# Patient Record
Sex: Female | Born: 1980 | Race: White | Hispanic: No | Marital: Married | Smoking: Never smoker
Health system: Southern US, Community
[De-identification: ages and names within clinical notes are randomized; demographics above are authoritative.]

## PROBLEM LIST (undated history)

## (undated) ENCOUNTER — Inpatient Hospital Stay (HOSPITAL_COMMUNITY): Payer: Self-pay

## (undated) DIAGNOSIS — Z789 Other specified health status: Secondary | ICD-10-CM

## (undated) HISTORY — PX: PILONIDAL CYST DRAINAGE: SHX743

## (undated) HISTORY — PX: DILATION AND CURETTAGE OF UTERUS: SHX78

---

## 2008-08-28 ENCOUNTER — Encounter (INDEPENDENT_AMBULATORY_CARE_PROVIDER_SITE_OTHER): Payer: Self-pay | Admitting: Obstetrics and Gynecology

## 2008-08-28 ENCOUNTER — Ambulatory Visit (HOSPITAL_COMMUNITY): Admission: RE | Admit: 2008-08-28 | Discharge: 2008-08-28 | Payer: Self-pay | Admitting: Radiology

## 2009-11-06 ENCOUNTER — Inpatient Hospital Stay (HOSPITAL_COMMUNITY): Admission: AD | Admit: 2009-11-06 | Discharge: 2009-11-08 | Payer: Self-pay | Admitting: Obstetrics and Gynecology

## 2010-09-01 LAB — CBC
HCT: 31.6 % — ABNORMAL LOW (ref 36.0–46.0)
Hemoglobin: 11.1 g/dL — ABNORMAL LOW (ref 12.0–15.0)
MCV: 96.7 fL (ref 78.0–100.0)
Platelets: 146 10*3/uL — ABNORMAL LOW (ref 150–400)
RDW: 13.1 % (ref 11.5–15.5)

## 2010-09-25 LAB — CBC
MCHC: 34.3 g/dL (ref 30.0–36.0)
MCV: 95.4 fL (ref 78.0–100.0)
RBC: 3.95 MIL/uL (ref 3.87–5.11)
RDW: 12.1 % (ref 11.5–15.5)

## 2010-10-28 NOTE — Op Note (Signed)
NAMESHIRI, HODAPP NO.:  [id number]   MEDICAL RECORD NO.:  [id number]          PATIENT TYPE:  AMB   LOCATION:  SDC                           FACILITY:  [facility]   PHYSICIAN:  Carrington Clamp, M.D. DATE OF BIRTH:  March 20, 1981   DATE OF PROCEDURE:  08/28/2008  DATE OF DISCHARGE:                               OPERATIVE REPORT   PREOPERATIVE DIAGNOSIS:  Missed abortion.   POSTOPERATIVE DIAGNOSIS:  Missed abortion.   PROCEDURE:  Dilation and evacuation.   SURGEON:  Carrington Clamp, MD.   ASSISTANT:  None.   ANESTHESIA:  LMA.   FINDINGS:  Seven-week size uterus down to 6 weeks with good crie.   SPECIMENS:  Uterine contents.   DISPOSITION:  To Pathology.   ESTIMATED BLOOD LOSS:  100 mL.   INTRAVENOUS FLUIDS:  1300 mL.   URINE OUTPUT:  Not measured.   COMPLICATIONS:  None.   MEDICATIONS:  Methergine IM.   COUNTS:  Correct x3.   TECHNIQUE:  After adequate LMA anesthesia was achieved, the patient was  prepped and draped in usual sterile fashion in dorsal lithotomy  position.  The bladder was emptied with a red rubber catheter and then  speculum placed in the vagina.  The cervix was dilated up with Shawnie Pons  dilators.  An 8-mm curette was unable to be passed.  A 7-mm  curette was used with alternating sharp and blunt dissection to remove  all uterine contents.  Once good crie was seen with sharp curettage, all  instruments were withdrawn from the vagina.  The patient tolerated  procedure well and returned to recovery room in stable condition.      Carrington Clamp, M.D.  Electronically Signed     MH/MEDQ  D:  08/28/2008  T:  08/29/2008  Job:  [id number]

## 2011-04-13 ENCOUNTER — Other Ambulatory Visit: Payer: Self-pay | Admitting: Obstetrics and Gynecology

## 2013-08-29 ENCOUNTER — Other Ambulatory Visit: Payer: Self-pay | Admitting: Obstetrics and Gynecology

## 2014-09-27 ENCOUNTER — Other Ambulatory Visit: Payer: Self-pay | Admitting: Obstetrics and Gynecology

## 2014-09-28 LAB — CYTOLOGY - PAP

## 2016-02-04 ENCOUNTER — Other Ambulatory Visit: Payer: Self-pay | Admitting: Family Medicine

## 2016-02-04 ENCOUNTER — Ambulatory Visit
Admission: RE | Admit: 2016-02-04 | Discharge: 2016-02-04 | Disposition: A | Discharge status: Home / Self Care | Payer: BLUE CROSS/BLUE SHIELD | Source: Ambulatory Visit | Attending: Family Medicine | Admitting: Family Medicine

## 2016-02-04 DIAGNOSIS — M545 Low back pain: Secondary | ICD-10-CM | POA: Diagnosis not present

## 2016-02-04 DIAGNOSIS — R52 Pain, unspecified: Secondary | ICD-10-CM

## 2016-02-25 ENCOUNTER — Ambulatory Visit: Payer: BLUE CROSS/BLUE SHIELD | Admitting: Physical Therapy

## 2016-02-28 ENCOUNTER — Encounter: Payer: BLUE CROSS/BLUE SHIELD | Admitting: Physical Therapy

## 2016-03-02 ENCOUNTER — Encounter: Payer: BLUE CROSS/BLUE SHIELD | Admitting: Physical Therapy

## 2016-03-10 ENCOUNTER — Ambulatory Visit: Payer: BLUE CROSS/BLUE SHIELD | Attending: Family Medicine | Admitting: Physical Therapy

## 2016-03-10 DIAGNOSIS — M25552 Pain in left hip: Secondary | ICD-10-CM | POA: Insufficient documentation

## 2016-03-10 DIAGNOSIS — M6281 Muscle weakness (generalized): Secondary | ICD-10-CM

## 2016-03-10 DIAGNOSIS — M25551 Pain in right hip: Secondary | ICD-10-CM | POA: Insufficient documentation

## 2016-03-10 NOTE — Therapy (Signed)
[place] [facility] [address] [place], [address] Phone: [phone]   Fax:  [phone]  Physical Therapy Evaluation  Patient Details  Name: Linda Webster MRN: [id number] Date of Birth: [birth year] Referring Provider: Jolene Provost  Encounter Date: 03/10/2016      PT End of Session - 03/10/16 1231    Visit Number 1   Number of Visits 9   Date for PT Re-Evaluation 05/05/16   PT Start Time 0815   PT Stop Time 0910   PT Time Calculation (min) 55 min   Activity Tolerance Patient tolerated treatment well;No increased pain   Behavior During Therapy [facility] for tasks assessed/performed      No past medical history on file.  No past surgical history on file.  There were no vitals filed for this visit.       Subjective Assessment - 03/10/16 1230    Subjective Pt is a runner, has had 1.5 yrs of "deep pain" in the region of her piriformis with aerobics workout. she was able to participate in a marathon with stretches issued by [employer].  Pt does have back and leg painwhich has radiated out from initial pain of piriformis. She has been not running as much but has been doing a bootcamp class, she is still having pain despite this. Pt has to travel significantly and is having pain with sitting/traveling which produces pain on L side.    Currently in Pain? Yes   Pain Score 4    Pain Location Hip   Pain Orientation Left            [facility] PT Assessment - 03/10/16 0001      Assessment   Medical Diagnosis piriformis syndrome   Referring Provider patel, kirtida   Prior Therapy none for this     Precautions   Precautions None     Restrictions   Weight Bearing Restrictions No     Balance Screen   Has the patient fallen in the past 6 months No   Has the patient had a decrease in activity level because of a fear of falling?  No   Is the patient reluctant to leave their home because of a fear of falling?  No     Home  [employer] information centre manager residence     Prior Function   Level of Independence Independent   Vocation Full time employment   Vocation Requirements sitting   Leisure active lifestyle, aerobics, running.     ROM / Strength   AROM / PROM / Strength AROM     AROM   Overall AROM Comments Full movement assessment: hypertonic multifidi, pain with and limited ROM in B hip flexion, extension, ER, L IR highly limited and painful. Strength is 5/5 but painful for B hip extension.     Palpation   Spinal mobility Pain with PA mobs over L2-L5   Palpation comment pain with palpation of glute, piriformis, vastus lateralis,                            PT Education - 03/10/16 1231    Education provided Yes   Education Details Course of PT, HEP   Person(s) Educated Patient   Methods Explanation   Comprehension Verbalized understanding             PT Long Term Goals - 03/10/16 1448      PT LONG  TERM GOAL #1   Title Pt will be I with HEP to improve motor control on L side to decr. poor femoral control as seen by SLS to 30 sec. with no valgus.   Baseline very poor femoral control with SLS.   Time 8   Period Weeks   Status New     PT LONG TERM GOAL #2   Title Pt will be able to run pain free   Baseline Pain with running for 5 min   Time 8   Period Weeks   Status New     PT LONG TERM GOAL #3   Title Pt will report no pain with palpation of piriformis indicating decr. myofascial trigger points and decr. resting pain   Baseline Significant pain with piriformis assessment   Time 8   Period Weeks   Status New               Plan - 03/10/16 1232    Clinical Impression Statement Pt is a pleasant 35 y/o female with c/o chronic hip pain. Pain began 1.5 yrs ago when pt began an aerobic routine. Pt is now unable to run or do aerobics due to pain. She is able to manage pain but is limiting her activity. Pt recently found out she is [redacted] wks pregnant and  reported some concern about how that will affect her pain and ability to participate in exercises. Pt has a high stress job involving travel which is painful for her. Pt would benefit from skilled PT to address these issues.    Rehab Potential Good   Clinical Impairments Affecting Rehab Potential chronic pain/motivation   PT Frequency 1x / week   PT Duration 8 weeks   PT Treatment/Interventions ADLs/Self Care Home Management;Aquatic Therapy;Dry needling;Therapeutic exercise;Patient/family education;Functional mobility training;Neuromuscular re-education;Manual techniques;Passive range of motion      Patient will benefit from skilled therapeutic intervention in order to improve the following deficits and impairments:  Pain, Improper body mechanics, Hypomobility, Decreased strength, Decreased range of motion  Visit Diagnosis: Muscle weakness (generalized) - Plan: PT plan of care cert/re-cert  Pain in left hip - Plan: PT plan of care cert/re-cert  Pain in right hip - Plan: PT plan of care cert/re-cert     Problem List There are no active problems to display for this patient.   Fisher,Benjamin PT DPT 03/10/2016, 2:55 PM  [facility] [address] , [place], [address] Phone: [phone]   Fax:  [phone]  Name: Linda Webster MRN: [id number] Date of Birth: 05/21/1981

## 2016-03-17 ENCOUNTER — Ambulatory Visit: Payer: BLUE CROSS/BLUE SHIELD | Attending: Family Medicine | Admitting: Physical Therapy

## 2016-03-17 DIAGNOSIS — M25552 Pain in left hip: Secondary | ICD-10-CM | POA: Diagnosis present

## 2016-03-17 DIAGNOSIS — M6281 Muscle weakness (generalized): Secondary | ICD-10-CM

## 2016-03-17 DIAGNOSIS — M25551 Pain in right hip: Secondary | ICD-10-CM | POA: Diagnosis present

## 2016-03-17 NOTE — Therapy (Signed)
[place] [facility] [address] [place], [place], [address] Phone: [phone]   Fax:  [phone]  Physical Therapy Treatment  Patient Details  Name: Linda Webster MRN: [id number] Date of Birth: 05/01/1981 Referring Provider: Jolene Provostpatel, kirtida  Encounter Date: 03/17/2016      PT End of Session - 03/17/16 1022    Visit Number 2   Number of Visits 9   Date for PT Re-Evaluation 05/05/16   PT Start Time 0945   PT Stop Time 1023   PT Time Calculation (min) 38 min   Activity Tolerance Patient tolerated treatment well;No increased pain   Behavior [facility] tasks assessed/performed      No past medical history on file.  No past surgical history on file.  There were no vitals filed for this visit.      Subjective Assessment - 03/17/16 0946    Subjective Pt reports she is "doing okay" today. When she walks she can feel significant tension in L IT band. She has noticed decr. ability to perform    Currently in Pain? Yes   Pain Score 3    Pain Location Hip   Pain Orientation Left                 Objective: Reviewed pelvic control/heel slide exercise.  Progressed this on R with UE overhead and cuing for rib control.    Attempted progression of this on L but unable to do so.   Standing partial lunge practice, cuing for performance of this.   Extensive STM performed on glute med/max, piriformis,  Vastus lateralis.  Trigger point dry needling performed following informed consent in vastus lateralis (no charge for this).  Following this pt reported "I definitely am more sore, but the pain is less with walking". Pt will attempt this HEP.                     PT Long Term Goals - 03/10/16 1448      PT LONG TERM GOAL #1   Title Pt will be I with HEP to improve motor control on L side to decr. poor femoral control as seen by SLS to 30 sec. with no valgus.   Baseline very poor femoral  control with SLS.   Time 8   Period Weeks   Status New     PT LONG TERM GOAL #2   Title Pt will be able to run pain free   Baseline Pain with running for 5 min   Time 8   Period Weeks   Status New     PT LONG TERM GOAL #3   Title Pt will report no pain with palpation of piriformis indicating decr. myofascial trigger points and decr. resting pain   Baseline Significant pain with piriformis assessment   Time 8   Period Weeks   Status New               Plan - 03/17/16 1023    Clinical Impression Statement Pt responded very well to gentle trigger oint dry needling, manual intervention for hip, and progression of ther ex to standing and more aggressive supine work. minimal pain following session.    Rehab Potential Good   Clinical Impairments Affecting Rehab Potential chronic pain/motivation   PT Frequency 1x / week   PT Duration 8 weeks   PT Treatment/Interventions ADLs/Self Care Home Management;Aquatic Therapy;Dry needling;Therapeutic exercise;Patient/family education;Functional mobility training;Neuromuscular re-education;Manual techniques;Passive range of  motion      Patient will benefit from skilled therapeutic intervention in order to improve the following deficits and impairments:  Pain, Improper body mechanics, Hypomobility, Decreased strength, Decreased range of motion  Visit Diagnosis: Muscle weakness (generalized)  Pain in left hip  Pain in right hip     Problem List There are no active problems to display for this patient.   Webster,Linda 03/17/2016, 10:26 AM   [facility] [address], [place], [address] Phone: [phone]   Fax:  [phone]  Name: Linda Webster MRN: [id number] Date of Birth: 09/04/1980

## 2016-03-30 ENCOUNTER — Ambulatory Visit: Payer: BLUE CROSS/BLUE SHIELD | Admitting: Physical Therapy

## 2016-04-03 ENCOUNTER — Other Ambulatory Visit: Payer: Self-pay | Admitting: Obstetrics and Gynecology

## 2016-04-13 ENCOUNTER — Ambulatory Visit: Payer: BLUE CROSS/BLUE SHIELD | Admitting: Physical Therapy

## 2016-04-13 DIAGNOSIS — M6281 Muscle weakness (generalized): Secondary | ICD-10-CM

## 2016-04-14 NOTE — Therapy (Signed)
[place] [facility] [address] [place], [place], [address] Phone: [phone]   Fax:  [phone]  Physical Therapy Treatment  Patient Details  Name: Linda Webster MRN: [id number] Date of Birth: 10/13/1980 Referring Provider: Jolene Provostpatel, kirtida  Encounter Date: 04/13/2016      PT End of Session - 04/13/16 0758    Visit Number 3   Number of Visits 9   Date for PT Re-Evaluation 05/05/16   PT Start Time 1300   PT Stop Time 1310   PT Time Calculation (min) 10 min   Activity Tolerance Patient tolerated treatment well;No increased pain   Behavior [facility] for tasks assessed/performed      No past medical history on file.  No past surgical history on file.  There were no vitals filed for this visit.      Subjective Assessment - 04/13/16 1337    Subjective Pt has not been seen for several sessions due to busy schedule. Continues to have some tension in IT band with walking. otherwise she has been fully pain free and would like to not continue with PT at this time.                  No objective data taken today as pt requested to not have a PT session due to resolution of pain and focus on pregnancy and work activities.                    PT Long Term Goals - 04/13/16 0759      PT LONG TERM GOAL #1   Title Pt will be I with HEP to improve motor control on L side to decr. poor femoral control as seen by SLS to 30 sec. with no valgus.   Baseline very poor femoral control with SLS.   Time 8   Period Weeks   Status Achieved     PT LONG TERM GOAL #2   Title Pt will be able to run pain free   Baseline Pain with running for 5 min   Time 8   Period Weeks   Status Deferred     PT LONG TERM GOAL #3   Title Pt will report no pain with palpation of piriformis indicating decr. myofascial trigger points and decr. resting pain   Baseline Significant pain with piriformis assessment   Time 8    Period Weeks   Status Achieved               Plan - 04/13/16 0758    Clinical Impression Statement At this time pt is on hold from PT, she is feeling much less pain, is focusing on pregnancy and job activities. Encouraged pt to return if needed but otherwise will look to re-start PT in the future as needed if pain returns.   Rehab Potential Good   Clinical Impairments Affecting Rehab Potential chronic pain/motivation   PT Frequency 1x / week   PT Duration 8 weeks   PT Treatment/Interventions ADLs/Self Care Home Management;Aquatic Therapy;Dry needling;Therapeutic exercise;Patient/family education;Functional mobility training;Neuromuscular re-education;Manual techniques;Passive range of motion      Patient will benefit from skilled therapeutic intervention in order to improve the following deficits and impairments:  Pain, Improper body mechanics, Hypomobility, Decreased strength, Decreased range of motion  Visit Diagnosis: Muscle weakness (generalized)     Problem List There are no active problems to display for this patient.   Remedios Mckone PT DPT 04/14/2016, 8:00 AM  [place] [facility] [address] , [place], [address] Phone: [phone]   Fax:  [phone]  Name: Linda CorinLisa Torrance MRN: [id number] Date of Birth: 08/09/1980

## 2016-04-20 ENCOUNTER — Ambulatory Visit: Payer: BLUE CROSS/BLUE SHIELD | Admitting: Physical Therapy

## 2016-04-27 ENCOUNTER — Ambulatory Visit: Payer: BLUE CROSS/BLUE SHIELD | Attending: Family Medicine | Admitting: Physical Therapy

## 2016-05-04 ENCOUNTER — Ambulatory Visit: Payer: BLUE CROSS/BLUE SHIELD | Admitting: Physical Therapy

## 2016-05-11 ENCOUNTER — Ambulatory Visit: Payer: BLUE CROSS/BLUE SHIELD | Admitting: Physical Therapy

## 2016-06-15 NOTE — L&D Delivery Note (Signed)
Delivery Note Patient pushed for less than 15 minutes after she was noted to be C/C/_1. At 4:04 AM a viable and healthy female was delivered via Vaginal, Spontaneous Delivery (Presentation OA).Tight nuchal cord reduced on perineum.  Shoulders and body easily delivered. Baby laid on maternal abdomen.   APGAR: 9, 9; weight 7 lb 0.7 oz (3195 g).  Cord double clamped and father cut umbilical cord. Placenta delivered spontaneously, intact 3 vessels. Cord blood obtained.  Complications: none Anesthesia:  Epidural  Episiotomy: None Lacerations: 2nd degree Suture Repair: 2.0 vicryl rapide Est. Blood Loss (mL): 350  Mom to postpartum.  Baby to Couplet care / Skin to Skin.  Essie HartINN, Eartha Vonbehren STACIA 10/22/2016, 7:11 AM

## 2016-09-22 ENCOUNTER — Encounter (HOSPITAL_COMMUNITY): Payer: Self-pay | Admitting: *Deleted

## 2016-09-22 ENCOUNTER — Ambulatory Visit (HOSPITAL_COMMUNITY): Admit: 2016-09-22 | Payer: BLUE CROSS/BLUE SHIELD

## 2016-09-22 ENCOUNTER — Inpatient Hospital Stay (HOSPITAL_COMMUNITY)
Admission: AD | Admit: 2016-09-22 | Discharge: 2016-09-22 | Disposition: A | Discharge status: Home / Self Care | Payer: BLUE CROSS/BLUE SHIELD | Source: Ambulatory Visit | Attending: Obstetrics | Admitting: Obstetrics

## 2016-09-22 ENCOUNTER — Inpatient Hospital Stay (HOSPITAL_COMMUNITY): Payer: BLUE CROSS/BLUE SHIELD

## 2016-09-22 ENCOUNTER — Ambulatory Visit (HOSPITAL_BASED_OUTPATIENT_CLINIC_OR_DEPARTMENT_OTHER)
Admit: 2016-09-22 | Discharge: 2016-09-22 | Disposition: A | Discharge status: Home / Self Care | Payer: BLUE CROSS/BLUE SHIELD | Attending: Obstetrics and Gynecology | Admitting: Obstetrics and Gynecology

## 2016-09-22 DIAGNOSIS — M79662 Pain in left lower leg: Secondary | ICD-10-CM | POA: Insufficient documentation

## 2016-09-22 DIAGNOSIS — M79605 Pain in left leg: Secondary | ICD-10-CM | POA: Insufficient documentation

## 2016-09-22 DIAGNOSIS — O163 Unspecified maternal hypertension, third trimester: Secondary | ICD-10-CM | POA: Diagnosis not present

## 2016-09-22 DIAGNOSIS — Z3A33 33 weeks gestation of pregnancy: Secondary | ICD-10-CM | POA: Insufficient documentation

## 2016-09-22 DIAGNOSIS — M7989 Other specified soft tissue disorders: Secondary | ICD-10-CM

## 2016-09-22 DIAGNOSIS — M79609 Pain in unspecified limb: Secondary | ICD-10-CM

## 2016-09-22 DIAGNOSIS — O26893 Other specified pregnancy related conditions, third trimester: Secondary | ICD-10-CM | POA: Diagnosis not present

## 2016-09-22 DIAGNOSIS — O2653 Maternal hypotension syndrome, third trimester: Secondary | ICD-10-CM

## 2016-09-22 DIAGNOSIS — O9989 Other specified diseases and conditions complicating pregnancy, childbirth and the puerperium: Secondary | ICD-10-CM

## 2016-09-22 DIAGNOSIS — R0602 Shortness of breath: Secondary | ICD-10-CM

## 2016-09-22 HISTORY — DX: Other specified health status: Z78.9

## 2016-09-22 LAB — CBC
HEMATOCRIT: 28.2 % — AB (ref 36.0–46.0)
HEMOGLOBIN: 10 g/dL — AB (ref 12.0–15.0)
MCH: 33.7 pg (ref 26.0–34.0)
MCHC: 35.5 g/dL (ref 30.0–36.0)
MCV: 94.9 fL (ref 78.0–100.0)
Platelets: 145 10*3/uL — ABNORMAL LOW (ref 150–400)
RBC: 2.97 MIL/uL — AB (ref 3.87–5.11)
RDW: 13.1 % (ref 11.5–15.5)
WBC: 7.5 10*3/uL (ref 4.0–10.5)

## 2016-09-22 LAB — COMPREHENSIVE METABOLIC PANEL
ALT: 17 U/L (ref 14–54)
ANION GAP: 5 (ref 5–15)
AST: 23 U/L (ref 15–41)
Albumin: 2.9 g/dL — ABNORMAL LOW (ref 3.5–5.0)
Alkaline Phosphatase: 66 U/L (ref 38–126)
BUN: 10 mg/dL (ref 6–20)
CHLORIDE: 105 mmol/L (ref 101–111)
CO2: 24 mmol/L (ref 22–32)
Calcium: 8.3 mg/dL — ABNORMAL LOW (ref 8.9–10.3)
Creatinine, Ser: 0.46 mg/dL (ref 0.44–1.00)
Glucose, Bld: 79 mg/dL (ref 65–99)
POTASSIUM: 4 mmol/L (ref 3.5–5.1)
Sodium: 134 mmol/L — ABNORMAL LOW (ref 135–145)
Total Bilirubin: 0.9 mg/dL (ref 0.3–1.2)
Total Protein: 5.7 g/dL — ABNORMAL LOW (ref 6.5–8.1)

## 2016-09-22 LAB — D-DIMER, QUANTITATIVE (NOT AT ARMC): D DIMER QUANT: 0.89 ug{FEU}/mL — AB (ref 0.00–0.50)

## 2016-09-22 LAB — TROPONIN I

## 2016-09-22 MED ORDER — IOPAMIDOL (ISOVUE-370) INJECTION 76%
100.0000 mL | Freq: Once | INTRAVENOUS | Status: AC | PRN
  Administered 2016-09-22: 100 mL via INTRAVENOUS

## 2016-09-22 MED ORDER — LACTATED RINGERS IV BOLUS (SEPSIS)
1000.0000 mL | Freq: Once | INTRAVENOUS | Status: AC
  Administered 2016-09-22: 1000 mL via INTRAVENOUS

## 2016-09-22 NOTE — Discharge Instructions (Signed)
Edema Edema is when you have too much fluid in your body or under your skin. Edema may make your legs, feet, and ankles swell up. Swelling is also common in looser tissues, like around your eyes. This is a common condition. It gets more common as you get older. There are many possible causes of edema. Eating too much salt (sodium) and being on your feet or sitting for a long time can cause edema in your legs, feet, and ankles. Hot weather may make edema worse. Edema is usually painless. Your skin may look swollen or shiny. Follow these instructions at home:  Keep the swollen body part raised (elevated) above the level of your heart when you are sitting or lying down.  Do not sit still or stand for a long time.  Do not wear tight clothes. Do not wear garters on your upper legs.  Exercise your legs. This can help the swelling go down.  Wear elastic bandages or support stockings as told by your doctor.  Eat a low-salt (low-sodium) diet to reduce fluid as told by your doctor.  Depending on the cause of your swelling, you may need to limit how much fluid you drink (fluid restriction).  Take over-the-counter and prescription medicines only as told by your doctor. Contact a doctor if:  Treatment is not working.  You have heart, liver, or kidney disease and have symptoms of edema.  You have sudden and unexplained weight gain. Get help right away if:  You have shortness of breath or chest pain.  You cannot breathe when you lie down.  You have pain, redness, or warmth in the swollen areas.  You have heart, liver, or kidney disease and get edema all of a sudden.  You have a fever and your symptoms get worse all of a sudden. Summary  Edema is when you have too much fluid in your body or under your skin.  Edema may make your legs, feet, and ankles swell up. Swelling is also common in looser tissues, like around your eyes.  Raise (elevate) the swollen body part above the level of your  heart when you are sitting or lying down.  Follow your doctor's instructions about diet and how much fluid you can drink (fluid restriction). This information is not intended to replace advice given to you by your health care provider. Make sure you discuss any questions you have with your health care provider. Document Released: 11/18/2007 Document Revised: 06/19/2016 Document Reviewed: 06/19/2016 Elsevier Interactive Patient Education  2017 Elsevier Inc.  

## 2016-09-22 NOTE — Progress Notes (Signed)
**  Preliminary report by tech**  Left lower extremity venous duplex complete. There is no evidence of deep or superficial vein thrombosis involving the left lower extremity. All visualized vessels appear patent and compressible. There is no evidence of a Baker's cyst on the left. Results were given to the patient's nurse, Jolynn.  09/22/16 1:41 PM Olen Cordial RVT

## 2016-09-22 NOTE — MAU Note (Signed)
Vascular lab informed of U/S order.

## 2016-09-22 NOTE — MAU Note (Signed)
Left calf swelling noted since Sat.  "charlie horse" in left calf, started Thu, just not letting up.  No hx of clot.

## 2016-09-22 NOTE — MAU Note (Signed)
Dr Schenk in to see pt 

## 2016-09-22 NOTE — MAU Provider Note (Signed)
History     CSN: [id number]  Arrival date and time: 09/22/16 0900   First Provider Initiated Contact with Patient 09/22/16 1009      Chief Complaint  Patient presents with  . calf pain  . Leg Swelling   Patient is a 36 year old G3 P1 at 33 weeks 1 day who presents today with calf pain and swelling. She reports her pain is been present for about 48 hours. It is throbbing in nature and was significantly more swollen yesterday. Upon arrival patient did have an episode with blood pressure 40 systolic associated with some shortness of breath lightheadedness and nausea. With lying the patient back and checking serial blood pressures it has increased but still remains less than 100 systolic. She reports her normal blood pressure is greater than 100 systolic. She still does not quite feel herself. As she begins to feel better she does not report any palpitations or shortness of breath however. She also has no chest pain. She reports she ate a large fracture best prior to going to her doctor's office this morning. She reports no personal history of blood clots and denies a family history of blood clots.    OB History    Gravida Para Term Preterm AB Living   SAB TAB Ectopic Multiple Live Births                  Past Medical History:  Diagnosis Date  . Medical history non-contributory     Past Surgical History:  Procedure Laterality Date  . DILATION AND CURETTAGE OF UTERUS    . PILONIDAL CYST DRAINAGE      No family history on file.  Social History  Substance Use Topics  . Smoking status: Never Smoker  . Smokeless tobacco: Never Used  . Alcohol use No    Allergies: No Known Allergies  Prescriptions Prior to Admission  Medication Sig Dispense Refill Last Dose  . Prenatal Vit-Fe Fumarate-FA (PRENATAL MULTIVITAMIN) TABS tablet Take 1 tablet by mouth at bedtime.   09/21/2016 at Unknown time    Review of Systems  Constitutional: Negative for chills and fever.   HENT: Negative for congestion and rhinorrhea.   Respiratory: Negative for apnea, cough, chest tightness and shortness of breath.   Cardiovascular: Positive for leg swelling. Negative for chest pain and palpitations.  Gastrointestinal: Negative for abdominal distention, abdominal pain, constipation, diarrhea, nausea and vomiting.  Genitourinary: Negative for difficulty urinating, dysuria, flank pain, frequency and hematuria.  Musculoskeletal: Negative for arthralgias and back pain.  Neurological: Negative for dizziness and weakness.   Physical Exam   Blood pressure 103/60, pulse 80, temperature 98.5 F (36.9 C), temperature source Oral, resp. rate 16, weight 160 lb 8 oz (72.8 kg), last menstrual period 02/03/2016, SpO2 100 %.  Physical Exam  Vitals reviewed. Constitutional: She is oriented to person, place, and time. She appears well-developed and well-nourished.  HENT:  Head: Normocephalic and atraumatic.  Cardiovascular: Normal rate and normal heart sounds.   Respiratory: Effort normal and breath sounds normal. No respiratory distress. She has no wheezes.  GI: Soft. Bowel sounds are normal. She exhibits no distension. There is no tenderness. There is no rebound and no guarding.  Musculoskeletal: Normal range of motion.  Edema on the left calf  Neurological: She is alert and oriented to person, place, and time. No cranial nerve deficit.  Skin: Skin is warm and dry. No erythema.  Psychiatric: She has a normal mood  and affect. Her behavior is normal.    MAU Course  Procedures  MDM In MAU patient initially had an episode of hypotension with near syncope. She was given a liter of fluid with increase in BP. She did symptomatically feel better at that time. CBC, CMP D-dimer and trop where checked and all were wnl for pregnancy.  D-dimer minimally elevated, but in the normal range for 3rd trimester.  Patient had a Doppler of her left lower extremity which had been swollen and was  negative however given these episodes of hypertension there was concerned that the clot could've potentially migrated to the heart and lungs causing these symptoms. Patient did not become hypoxic or tachycardic but given her pregnancy and symptoms we believe prudent to rule out PE.  CTA of the chest was performed and found to be negative with no pulmonary pathology including signs of pulmonary embolus.  It was discharged home with instructions to follow up with her OBs next week for potential referral to cardiology if symptoms of hypotension and near syncope continued.  Assessment and Plan  #1: Left calf swelling workup negative for PE advised elevation of the legs and symptomatic control. #2: Hypertension unclear etiology may be vagal symptoms from stress or anxiety ruled out PE at this time. Follow up recommended with cardiology if symptoms continue. Discussed with Dr. Stephannie Li 09/22/2016, 10:45 AM

## 2016-09-22 NOTE — MAU Note (Signed)
Urine in lab 

## 2016-09-22 NOTE — Progress Notes (Signed)
CNM informed pt feeling very hot, dizzy & clammy.  Also informed of low BP.  Pt conscious & talking the entire time.

## 2016-10-07 ENCOUNTER — Other Ambulatory Visit: Payer: Self-pay | Admitting: Obstetrics & Gynecology

## 2016-10-21 ENCOUNTER — Inpatient Hospital Stay (HOSPITAL_COMMUNITY)
Admission: AD | Admit: 2016-10-21 | Discharge: 2016-10-23 | DRG: 775 | Disposition: A | Discharge status: Home / Self Care | Payer: BLUE CROSS/BLUE SHIELD | Source: Ambulatory Visit | Attending: Obstetrics & Gynecology | Admitting: Obstetrics & Gynecology

## 2016-10-21 ENCOUNTER — Encounter (HOSPITAL_COMMUNITY): Payer: Self-pay | Admitting: *Deleted

## 2016-10-21 ENCOUNTER — Inpatient Hospital Stay (HOSPITAL_COMMUNITY): Payer: BLUE CROSS/BLUE SHIELD | Admitting: Anesthesiology

## 2016-10-21 DIAGNOSIS — Z3A37 37 weeks gestation of pregnancy: Secondary | ICD-10-CM | POA: Diagnosis not present

## 2016-10-21 DIAGNOSIS — Z3493 Encounter for supervision of normal pregnancy, unspecified, third trimester: Secondary | ICD-10-CM | POA: Diagnosis present

## 2016-10-21 LAB — CBC
HCT: 35.4 % — ABNORMAL LOW (ref 36.0–46.0)
HEMOGLOBIN: 12.4 g/dL (ref 12.0–15.0)
MCH: 33.3 pg (ref 26.0–34.0)
MCHC: 35 g/dL (ref 30.0–36.0)
MCV: 95.2 fL (ref 78.0–100.0)
Platelets: 163 10*3/uL (ref 150–400)
RBC: 3.72 MIL/uL — ABNORMAL LOW (ref 3.87–5.11)
RDW: 13.5 % (ref 11.5–15.5)
WBC: 9 10*3/uL (ref 4.0–10.5)

## 2016-10-21 LAB — TYPE AND SCREEN
ABO/RH(D): O POS
ANTIBODY SCREEN: NEGATIVE

## 2016-10-21 MED ORDER — FLEET ENEMA 7-19 GM/118ML RE ENEM: 1.0000 | ENEMA | RECTAL | Status: DC | PRN

## 2016-10-21 MED ORDER — ACETAMINOPHEN 325 MG PO TABS: 650.0000 mg | ORAL_TABLET | ORAL | Status: DC | PRN

## 2016-10-21 MED ORDER — FENTANYL 2.5 MCG/ML BUPIVACAINE 1/10 % EPIDURAL INFUSION (WH - ANES)
14.0000 mL/h | INTRAMUSCULAR | Status: DC | PRN
  Administered 2016-10-21: 14 mL/h via EPIDURAL
  Filled 2016-10-21: qty 100

## 2016-10-21 MED ORDER — LIDOCAINE HCL (PF) 1 % IJ SOLN
INTRAMUSCULAR | Status: DC | PRN
  Administered 2016-10-21 (×2): 5 mL

## 2016-10-21 MED ORDER — DIPHENHYDRAMINE HCL 50 MG/ML IJ SOLN: 12.5000 mg | INTRAMUSCULAR | Status: DC | PRN

## 2016-10-21 MED ORDER — EPHEDRINE 5 MG/ML INJ
10.0000 mg | INTRAVENOUS | Status: DC | PRN
  Filled 2016-10-21: qty 2

## 2016-10-21 MED ORDER — LACTATED RINGERS IV SOLN: 500.0000 mL | Freq: Once | INTRAVENOUS | Status: DC

## 2016-10-21 MED ORDER — LACTATED RINGERS IV SOLN: 500.0000 mL | INTRAVENOUS | Status: DC | PRN

## 2016-10-21 MED ORDER — LACTATED RINGERS IV SOLN
INTRAVENOUS | Status: DC
  Administered 2016-10-21: 22:00:00 via INTRAVENOUS

## 2016-10-21 MED ORDER — PHENYLEPHRINE 40 MCG/ML (10ML) SYRINGE FOR IV PUSH (FOR BLOOD PRESSURE SUPPORT)
80.0000 ug | PREFILLED_SYRINGE | INTRAVENOUS | Status: AC | PRN
  Administered 2016-10-21 (×3): 80 ug via INTRAVENOUS

## 2016-10-21 MED ORDER — OXYTOCIN BOLUS FROM INFUSION
500.0000 mL | Freq: Once | INTRAVENOUS | Status: AC
  Administered 2016-10-22: 500 mL via INTRAVENOUS

## 2016-10-21 MED ORDER — EPHEDRINE 5 MG/ML INJ
10.0000 mg | INTRAVENOUS | Status: DC | PRN
  Administered 2016-10-21: 10 mg via INTRAVENOUS
  Filled 2016-10-21: qty 2

## 2016-10-21 MED ORDER — SOD CITRATE-CITRIC ACID 500-334 MG/5ML PO SOLN: 30.0000 mL | ORAL | Status: DC | PRN

## 2016-10-21 MED ORDER — PHENYLEPHRINE 40 MCG/ML (10ML) SYRINGE FOR IV PUSH (FOR BLOOD PRESSURE SUPPORT)
80.0000 ug | PREFILLED_SYRINGE | INTRAVENOUS | Status: DC | PRN
  Filled 2016-10-21: qty 5
  Filled 2016-10-21: qty 10

## 2016-10-21 MED ORDER — OXYCODONE-ACETAMINOPHEN 5-325 MG PO TABS
1.0000 | ORAL_TABLET | ORAL | Status: DC | PRN
  Administered 2016-10-22 (×2): 1 via ORAL
  Filled 2016-10-21 (×2): qty 1

## 2016-10-21 MED ORDER — OXYCODONE-ACETAMINOPHEN 5-325 MG PO TABS: 2.0000 | ORAL_TABLET | ORAL | Status: DC | PRN

## 2016-10-21 MED ORDER — OXYTOCIN 40 UNITS IN LACTATED RINGERS INFUSION - SIMPLE MED
2.5000 [IU]/h | INTRAVENOUS | Status: DC
  Administered 2016-10-22: 2.5 [IU]/h via INTRAVENOUS
  Filled 2016-10-21: qty 1000

## 2016-10-21 MED ORDER — LIDOCAINE HCL (PF) 1 % IJ SOLN
30.0000 mL | INTRAMUSCULAR | Status: DC | PRN
  Filled 2016-10-21: qty 30

## 2016-10-21 MED ORDER — ONDANSETRON HCL 4 MG/2ML IJ SOLN
4.0000 mg | Freq: Four times a day (QID) | INTRAMUSCULAR | Status: DC | PRN
  Administered 2016-10-21: 4 mg via INTRAVENOUS
  Filled 2016-10-21: qty 2

## 2016-10-21 NOTE — Anesthesia Pain Management Evaluation Note (Signed)
  CRNA Pain Management Visit Note  Patient: Linda Webster, 36 y.o., female  "Hello I am a member of the anesthesia team at [facility]. We have an anesthesia team available at all times to provide care throughout the hospital, including epidural management and anesthesia for C-section. I don't know your plan for the delivery whether it a natural birth, water birth, IV sedation, nitrous supplementation, doula or epidural, but we want to meet your pain goals."   1.Was your pain managed to your expectations on prior hospitalizations?   Yes   2.What is your expectation for pain management during this hospitalization?     Epidural  3.How can we help you reach that goal?   Record the patient's initial score and the patient's pain goal.   Pain: 7  Pain Goal: 7 [facility] wants you to be able to say your pain was always managed very well.  Laban EmperorMalinova,Linda Webster 10/21/2016

## 2016-10-21 NOTE — H&P (Signed)
Linda Webster is a 36 y.o. female presenting for regular painful contractions. No LOF, no vaginal bleeding, She notes  Good FM.  Her prenatal course was uncomplicated.     OB History    Gravida Para Term Preterm AB Living   3 1 1   1 1    SAB TAB Ectopic Multiple Live Births                 Past Medical History:  Diagnosis Date  . Medical history non-contributory    Past Surgical History:  Procedure Laterality Date  . DILATION AND CURETTAGE OF UTERUS    . PILONIDAL CYST DRAINAGE     Family History: family history is not on file. Social History:  reports that she has never smoked. She has never used smokeless tobacco. She reports that she does not drink alcohol or use drugs.     Maternal Diabetes: No Genetic Screening: Normal   NIPT 46XX Maternal Ultrasounds/Referrals: Normal Fetal Ultrasounds or other Referrals:  Other:  Normal fetal US Maternal Substance Abuse:  No Significant Maternal Medications:  None Significant Maternal Lab Results:  Lab values include: Group B Strep negative Other Comments:  None  ROS Maternal Medical History:  Reason for admission: Contractions.   Contractions: Onset was 13-24 hours ago.   Frequency: regular.   Duration is approximately 60 seconds.   Perceived severity is strong.    Fetal activity: Perceived fetal activity is normal.   Last perceived fetal movement was within the past hour.    Prenatal complications: no prenatal complications Prenatal Complications - Diabetes: none.    Dilation: 4 Effacement (%): 80 Station: -3 Exam by:: B Mosca Blood pressure (!) 103/56, pulse 75, temperature 97.8 F (36.6 C), temperature source Oral, resp. rate 18, height 5\' 7"  (1.702 m), weight 75.3 kg (166 lb), last menstrual period 02/03/2016. Maternal Exam:  Uterine Assessment: Contraction strength is moderate.  Contraction duration is 60 seconds. Contraction frequency is regular.   Abdomen: Patient reports no abdominal tenderness. Fundal height  is 37 cm.   Estimated fetal weight is 3000 grams.   Fetal presentation: vertex  Introitus: Normal vulva. Normal vagina.  Ferning test: not done.  Nitrazine test: not done.  Pelvis: adequate for delivery.   Cervix: I checked patient in office today: 4/90/-1  Fetal Exam Fetal Monitor Review: Baseline rate: 145.  Variability: moderate (6-25 bpm).   Pattern: accelerations present and no decelerations.    Fetal State Assessment: Category I - tracings are normal.     Physical Exam  Nursing note and vitals reviewed.   Prenatal labs: ABO, Rh:  O +  Antibody:  Negative Rubella:  Immune RPR:   NR  HBsAg:   Neg HIV:   NR GBS:   Neg  Assessment/Plan: 36 yo G3P1011 at 37 weeks 2 days in early active labor Patient with h/o very quick progression to delivery with her last delivery.  Admit to L&D Epidural on demand Continuous monitoring Augment with AROM    Tore Carreker STACIA 10/21/2016, 9:56 PM

## 2016-10-21 NOTE — Anesthesia Procedure Notes (Signed)
Epidural Patient location during procedure: OB  Staffing Anesthesiologist: Bayley Hurn Performed: anesthesiologist   Preanesthetic Checklist Completed: patient identified, site marked, surgical consent, pre-op evaluation, timeout performed, IV checked, risks and benefits discussed and monitors and equipment checked  Epidural Patient position: sitting Prep: DuraPrep Patient monitoring: heart rate, continuous pulse ox and blood pressure Approach: right paramedian Location: L3-L4 Injection technique: LOR saline  Needle:  Needle type: Tuohy  Needle gauge: 17 G Needle length: 9 cm and 9 Needle insertion depth: 6 cm Catheter type: closed end flexible Catheter size: 20 Guage Catheter at skin depth: 10 cm Test dose: negative  Assessment Events: blood not aspirated, injection not painful, no injection resistance, negative IV test and no paresthesia  Additional Notes Patient identified. Risks/Benefits/Options discussed with patient including but not limited to bleeding, infection, nerve damage, paralysis, failed block, incomplete pain control, headache, blood pressure changes, nausea, vomiting, reactions to medication both or allergic, itching and postpartum back pain. Confirmed with bedside nurse the patient's most recent platelet count. Confirmed with patient that they are not currently taking any anticoagulation, have any bleeding history or any family history of bleeding disorders. Patient expressed understanding and wished to proceed. All questions were answered. Sterile technique was used throughout the entire procedure. Please see nursing notes for vital signs. Test dose was given through epidural needle and negative prior to continuing to dose epidural or start infusion. Warning signs of high block given to the patient including shortness of breath, tingling/numbness in hands, complete motor block, or any concerning symptoms with instructions to call for help. Patient was given  instructions on fall risk and not to get out of bed. All questions and concerns addressed with instructions to call with any issues.     

## 2016-10-21 NOTE — MAU Note (Addendum)
Pt reports uc's and history of rapid delivery. Pt was 4 cm in office today

## 2016-10-21 NOTE — MAU Note (Signed)
Urine sent to lab 

## 2016-10-21 NOTE — Anesthesia Preprocedure Evaluation (Signed)

## 2016-10-22 ENCOUNTER — Encounter (HOSPITAL_COMMUNITY): Payer: Self-pay

## 2016-10-22 LAB — ABO/RH: ABO/RH(D): O POS

## 2016-10-22 LAB — RPR: RPR Ser Ql: NONREACTIVE

## 2016-10-22 MED ORDER — ONDANSETRON HCL 4 MG/2ML IJ SOLN: 4.0000 mg | INTRAMUSCULAR | Status: DC | PRN

## 2016-10-22 MED ORDER — COCONUT OIL OIL: 1.0000 "application " | TOPICAL_OIL | Status: DC | PRN

## 2016-10-22 MED ORDER — IBUPROFEN 600 MG PO TABS
600.0000 mg | ORAL_TABLET | Freq: Four times a day (QID) | ORAL | Status: DC
  Administered 2016-10-22 – 2016-10-23 (×5): 600 mg via ORAL
  Filled 2016-10-22 (×5): qty 1

## 2016-10-22 MED ORDER — TERBUTALINE SULFATE 1 MG/ML IJ SOLN
0.2500 mg | Freq: Once | INTRAMUSCULAR | Status: DC | PRN
  Filled 2016-10-22: qty 1

## 2016-10-22 MED ORDER — TETANUS-DIPHTH-ACELL PERTUSSIS 5-2.5-18.5 LF-MCG/0.5 IM SUSP: 0.5000 mL | Freq: Once | INTRAMUSCULAR | Status: DC

## 2016-10-22 MED ORDER — OXYTOCIN 40 UNITS IN LACTATED RINGERS INFUSION - SIMPLE MED
1.0000 m[IU]/min | INTRAVENOUS | Status: DC
  Administered 2016-10-22: 2 m[IU]/min via INTRAVENOUS

## 2016-10-22 MED ORDER — ACETAMINOPHEN 325 MG PO TABS: 650.0000 mg | ORAL_TABLET | ORAL | Status: DC | PRN

## 2016-10-22 MED ORDER — DIBUCAINE 1 % RE OINT
1.0000 | TOPICAL_OINTMENT | RECTAL | Status: DC | PRN
Start: 2016-10-22 — End: 2016-10-23

## 2016-10-22 MED ORDER — PRENATAL MULTIVITAMIN CH
1.0000 | ORAL_TABLET | Freq: Every day | ORAL | Status: DC
  Administered 2016-10-22 – 2016-10-23 (×2): 1 via ORAL
  Filled 2016-10-22 (×2): qty 1

## 2016-10-22 MED ORDER — ONDANSETRON HCL 4 MG PO TABS: 4.0000 mg | ORAL_TABLET | ORAL | Status: DC | PRN

## 2016-10-22 MED ORDER — DIPHENHYDRAMINE HCL 25 MG PO CAPS: 25.0000 mg | ORAL_CAPSULE | Freq: Four times a day (QID) | ORAL | Status: DC | PRN

## 2016-10-22 MED ORDER — BENZOCAINE-MENTHOL 20-0.5 % EX AERO
1.0000 "application " | INHALATION_SPRAY | CUTANEOUS | Status: DC | PRN
  Administered 2016-10-22: 1 via TOPICAL
  Filled 2016-10-22 (×2): qty 56

## 2016-10-22 MED ORDER — SIMETHICONE 80 MG PO CHEW: 80.0000 mg | CHEWABLE_TABLET | ORAL | Status: DC | PRN

## 2016-10-22 MED ORDER — WITCH HAZEL-GLYCERIN EX PADS: 1.0000 "application " | MEDICATED_PAD | CUTANEOUS | Status: DC | PRN

## 2016-10-22 MED ORDER — SENNOSIDES-DOCUSATE SODIUM 8.6-50 MG PO TABS
2.0000 | ORAL_TABLET | ORAL | Status: DC
  Administered 2016-10-22: 2 via ORAL
  Filled 2016-10-22: qty 2

## 2016-10-22 MED ORDER — ZOLPIDEM TARTRATE 5 MG PO TABS: 5.0000 mg | ORAL_TABLET | Freq: Every evening | ORAL | Status: DC | PRN

## 2016-10-22 NOTE — Anesthesia Postprocedure Evaluation (Signed)
Anesthesia Post Note  Patient: Linda Webster  Procedure(s) Performed: * No procedures listed *  Patient location during evaluation: Mother Baby Anesthesia Type: Epidural Level of consciousness: awake and alert and oriented Pain management: satisfactory to patient Vital Signs Assessment: post-procedure vital signs reviewed and stable Respiratory status: spontaneous breathing and nonlabored ventilation Cardiovascular status: stable Postop Assessment: no headache, no backache, no signs of nausea or vomiting, adequate PO intake and patient able to bend at knees (patient up walking) Anesthetic complications: no        Last Vitals:  Vitals:   10/22/16 0800 10/22/16 0900  BP: 108/66 113/62  Pulse: 82 78  Resp: 18 18  Temp: 36.4 C 36.2 C    Last Pain:  Vitals:   10/22/16 1145  TempSrc:   PainSc: 0-No pain   Pain Goal:                 Dannell Gortney

## 2016-10-22 NOTE — Lactation Note (Signed)
This note was copied from a baby's chart. Lactation Consultation Note: Infant is 8 hours old and has had 2 good feedings. Mother reports that she breastfed her first child for 5 months. Mother reports that infant just had a 20-25 min feeding. Mother sees colostrum when hand expresses. Advised mother to feed with feeding cues and do frequent skin to skin. Mother to feed infant 8-12 times in 24 hours. Lactation brochure given with information on all LC services.   Patient Name: Girl Beaulah CorinLisa Ketron ZOXWR'UToday's Date: 10/22/2016 Reason for consult: Initial assessment   Maternal Data Has patient been taught Hand Expression?: Yes Does the patient have breastfeeding experience prior to this delivery?: Yes  Feeding Feeding Type: Breast Fed Length of feed: 20 min (mother reports a good feeding. )  LATCH Score/Interventions Latch: Grasps breast easily, tongue down, lips flanged, rhythmical sucking.  Audible Swallowing: A few with stimulation Intervention(s): Skin to skin;Hand expression  Type of Nipple: Everted at rest and after stimulation  Comfort (Breast/Nipple): Soft / non-tender     Hold (Positioning): Assistance needed to correctly position infant at breast and maintain latch. Intervention(s): Breastfeeding basics reviewed;Support Pillows;Position options;Skin to skin  LATCH Score: 8  Lactation Tools Discussed/Used     Consult Status Consult Status: Follow-up Date: 10/22/16 Follow-up type: In-patient    Stevan BornKendrick, Cerenity Goshorn [facility] 10/22/2016, 12:35 PM

## 2016-10-23 LAB — CBC
HCT: 31.2 % — ABNORMAL LOW (ref 36.0–46.0)
HEMOGLOBIN: 10.7 g/dL — AB (ref 12.0–15.0)
MCH: 33.5 pg (ref 26.0–34.0)
MCHC: 34.3 g/dL (ref 30.0–36.0)
MCV: 97.8 fL (ref 78.0–100.0)
Platelets: 135 10*3/uL — ABNORMAL LOW (ref 150–400)
RBC: 3.19 MIL/uL — AB (ref 3.87–5.11)
RDW: 13.9 % (ref 11.5–15.5)
WBC: 8 10*3/uL (ref 4.0–10.5)

## 2016-10-23 MED ORDER — IBUPROFEN 600 MG PO TABS: 600.0000 mg | ORAL_TABLET | Freq: Four times a day (QID) | ORAL | 0 refills | Status: DC | PRN

## 2016-10-23 MED ORDER — DOCUSATE SODIUM 100 MG PO CAPS: 100.0000 mg | ORAL_CAPSULE | Freq: Two times a day (BID) | ORAL | 0 refills | Status: DC

## 2016-10-23 MED ORDER — OXYCODONE-ACETAMINOPHEN 5-325 MG PO TABS: 1.0000 | ORAL_TABLET | ORAL | 0 refills | Status: DC | PRN

## 2016-10-23 NOTE — Discharge Summary (Signed)
Obstetric Discharge Summary Reason for Admission: onset of labor Prenatal Procedures: NST and ultrasound Intrapartum Procedures: spontaneous vaginal delivery Postpartum Procedures: none Complications-Operative and Postpartum: 2nd degree perineal laceration Hemoglobin  Date Value Ref Range Status  10/23/2016 10.7 (L) 12.0 - 15.0 g/dL Final   HCT  Date Value Ref Range Status  10/23/2016 31.2 (L) 36.0 - 46.0 % Final    Physical Exam:  General: alert, cooperative and appears stated age 49Lochia: appropriate Uterine Fundus: firm  Discharge Diagnoses: Term Pregnancy-delivered  Discharge Information: Date: 10/23/2016 Activity: pelvic rest Diet: routine Medications: Ibuprofen, Colace and Percocet Condition: improved Instructions: refer to practice specific booklet Discharge to: home Follow-up Information    Essie HartPinn, Walda, MD Follow up in 4 week(s).   Specialty:  Obstetrics and Gynecology Why:  For a postpartum eval Contact information: [address] [place] [address] [phone]           Newborn Data: Live born female  Birth Weight: 7 lb 0.7 oz (3195 g) APGAR: 9, 9  Home with mother.  Linn Clavin H. 10/23/2016, 8:51 AM

## 2016-10-23 NOTE — Lactation Note (Signed)
This note was copied from a baby's chart. Lactation Consultation Note  Patient Name: Linda Webster ZOXWR'UToday's Date: 10/23/2016 Reason for consult: Follow-up assessment  Baby 30 hours old, 573w3d GA. Mom just latching baby to right breast in cradle position. Baby latched deeply and suckling rhythmically with occasional stimulation. Demonstrated to mom how to flange lower lip. Mom reports that baby has only been sleepy at the breast a couple of feedings. Discussed infant behavior d/t gestation, and enc limiting stimulation until closer to due date. Dicussed watching that baby not becoming tired at the breast and not either nursing well or not nursing often. Mom states that she had a good breast milk supply with first child until she returned to work and started pumping.   Mom aware of OP/BFSG and LC phone line assistance after D/C.   Maternal Data    Feeding Feeding Type: Breast Fed Length of feed: 15 min  LATCH Score/Interventions Latch: Grasps breast easily, tongue down, lips flanged, rhythmical sucking.  Audible Swallowing: A few with stimulation Intervention(s): Skin to skin  Type of Nipple: Everted at rest and after stimulation  Comfort (Breast/Nipple): Soft / non-tender     Hold (Positioning): No assistance needed to correctly position infant at breast. Intervention(s): Breastfeeding basics reviewed;Support Pillows;Position options;Skin to skin  LATCH Score: 9  Lactation Tools Discussed/Used     Consult Status Consult Status: PRN    Sherlyn HayJennifer D [name] 10/23/2016, 10:54 AM

## 2016-11-27 ENCOUNTER — Other Ambulatory Visit: Payer: Self-pay | Admitting: Obstetrics & Gynecology

## 2016-12-02 LAB — CYTOLOGY - PAP

## 2017-02-18 ENCOUNTER — Ambulatory Visit: Payer: Self-pay | Admitting: Adult Health

## 2017-02-18 ENCOUNTER — Encounter: Payer: Self-pay | Admitting: Adult Health

## 2017-02-18 VITALS — BP 104/68 | HR 69 | Temp 99.4°F | Wt 142.6 lb

## 2017-02-18 DIAGNOSIS — J019 Acute sinusitis, unspecified: Secondary | ICD-10-CM

## 2017-02-18 DIAGNOSIS — Z789 Other specified health status: Secondary | ICD-10-CM | POA: Insufficient documentation

## 2017-02-18 MED ORDER — AMOXICILLIN 875 MG PO TABS: 875.0000 mg | ORAL_TABLET | Freq: Two times a day (BID) | ORAL | 0 refills | Status: DC

## 2017-02-18 NOTE — Progress Notes (Addendum)
Subjective:     Patient ID: Linda Webster, female   DOB: 11/15/1980, 36 y.o.   MRN: [id number]  HPI  Patient is a 36 year old female with sinus type symptoms, she has had nasal congestion, sore throat, and yellow nasal discharge and facial pressure. She denies any fevers  She has had the symptoms for 2 weeks. She reports symptoms worsened in severity last night and are no longer mild but moderate. She reports occasional cough due to drainage in throat. She denies any chest pain, shortness of breath or wheezing. She denies any nausea, vomiting or diarrhea. Febrile in office as documented below.  Vitals:   02/18/17 1004  Weight: 142 lb 9.6 oz (64.7 kg)   Blood pressure 104/68, pulse 69, temperature 99.4 F (37.4 C), temperature source Tympanic, weight 142 lb 9.6 oz (64.7 kg), SpO2 97 %, unknown if currently breastfeeding.  Patient Active Problem List   Diagnosis Date Noted  . Breastfeeding (infant) 02/18/2017  . Indication for care in labor and delivery, antepartum 10/21/2016    Her children are sick with same symptoms.    Current Outpatient Prescriptions:  .  NORLYDA 0.35 MG tablet, , Disp: , Rfl: 8   Denies any chance of pregnancy she reports not sexually  active at this time.   Currently breastfeeding 594 month old in daycare.  She is on birth control.    Review of Systems  Constitutional: Negative.   HENT: Positive for congestion, ear pain (pressure only not pain ), postnasal drip, sinus pain, sinus pressure and sore throat (started last night). Negative for sneezing.   Eyes: Negative.   Respiratory: Negative.   Cardiovascular: Negative.   Genitourinary: Negative.   Musculoskeletal: Negative.   Skin: Negative.   Allergic/Immunologic: Negative.   Neurological: Negative.   Psychiatric/Behavioral: Negative.        Objective:   Physical Exam  Constitutional: She is oriented to person, place, and time. She appears well-nourished.  Non-toxic appearance. She does not have a  sickly appearance. She does not appear ill. No distress.  Febrile   HENT:  Head: Normocephalic and atraumatic.  Right Ear: Hearing, tympanic membrane, external ear and ear canal normal. Tympanic membrane is not erythematous.  Left Ear: Hearing, tympanic membrane, external ear and ear canal normal. Tympanic membrane is not erythematous.  Nose: Mucosal edema and rhinorrhea present. Right sinus exhibits maxillary sinus tenderness. Right sinus exhibits no frontal sinus tenderness. Left sinus exhibits maxillary sinus tenderness. Left sinus exhibits no frontal sinus tenderness.  Mouth/Throat: Uvula is midline and mucous membranes are normal. No uvula swelling. Posterior oropharyngeal erythema (mild with post nasal drip clear yellow ) present.  Eyes: Pupils are equal, round, and reactive to light. Conjunctivae, EOM and lids are normal.  Neck: Trachea normal and normal range of motion. Neck supple.  Cardiovascular: Normal rate, regular rhythm, normal heart sounds and intact distal pulses.  Exam reveals no gallop and no friction rub.   No murmur heard. Pulmonary/Chest: Effort normal and breath sounds normal. No respiratory distress. She has no wheezes. She has no rales. She exhibits no tenderness.  Abdominal: She exhibits no distension. There is no tenderness.  Musculoskeletal: Normal range of motion.  Patient moves on and off exam table without any difficulty. She has gait that is sure and steady in the room and in hall.   Lymphadenopathy:       Head (right side): Submandibular adenopathy present.       Head (left side): Submandibular adenopathy present.  Mild  bilateral small submandibular nodes palpable.   Neurological: She is alert and oriented to person, place, and time.  Skin: Skin is warm and dry. No rash noted. She is not diaphoretic. No erythema. No pallor.  Psychiatric: She has a normal mood and affect. Judgment and thought content normal.  Nursing note and vitals reviewed.       Assessment:    Acute sinusitis, recurrence not specified, unspecified location  Breastfeeding (infant)      Plan:     E prescribed ANTIBIOTIC AS BELOW ONLY- norlyda was not ordered at this visit. Meds ordered this encounter  Medications  . NORLYDA 0.35 MG tablet    Refill:  8  . amoxicillin (AMOXIL) 875 MG tablet    Sig: Take 1 tablet (875 mg total) by mouth 2 (two) times daily.    Dispense:  20 tablet    Refill:  0   Return to clinic at any time  if any new symptoms change, worsen or do not improve. Symptoms should improve  within 72 hours and if not improving you should call for an appointment at the clinic or be seen in urgent care/ED if clinic is closed. Report any infant symptoms or side effects to pediatrician - watch for signs of thrush.   Patient verbalized above understanding of all instructions.  Patient verbalized understanding of instructions and denies any further questions at this time.

## 2017-02-18 NOTE — Patient Instructions (Signed)
Amoxicillin capsules or tablets What is this medicine? AMOXICILLIN (a mox i SIL in) is a penicillin antibiotic. It is used to treat certain kinds of bacterial infections. It will not work for colds, flu, or other viral infections. This medicine may be used for other purposes; ask your health care provider or pharmacist if you have questions. COMMON BRAND NAME(S): Amoxil, Moxilin, Sumox, Trimox What should I tell my health care provider before I take this medicine? They need to know if you have any of these conditions: -asthma -kidney disease -an unusual or allergic reaction to amoxicillin, other penicillins, cephalosporin antibiotics, other medicines, foods, dyes, or preservatives -pregnant or trying to get pregnant -breast-feeding How should I use this medicine? Take this medicine by mouth with a glass of water. Follow the directions on your prescription label. You may take this medicine with food or on an empty stomach. Take your medicine at regular intervals. Do not take your medicine more often than directed. Take all of your medicine as directed even if you think your are better. Do not skip doses or stop your medicine early. Talk to your pediatrician regarding the use of this medicine in children. While this drug may be prescribed for selected conditions, precautions do apply. Overdosage: If you think you have taken too much of this medicine contact a poison control center or emergency room at once. NOTE: This medicine is only for you. Do not share this medicine with others. What if I miss a dose? If you miss a dose, take it as soon as you can. If it is almost time for your next dose, take only that dose. Do not take double or extra doses. What may interact with this medicine? -amiloride -birth control pills -chloramphenicol -macrolides -probenecid -sulfonamides -tetracyclines This list may not describe all possible interactions. Give your health care provider a list of all the  medicines, herbs, non-prescription drugs, or dietary supplements you use. Also tell them if you smoke, drink alcohol, or use illegal drugs. Some items may interact with your medicine. What should I watch for while using this medicine? Tell your doctor or health care professional if your symptoms do not improve in 2 or 3 days. Take all of the doses of your medicine as directed. Do not skip doses or stop your medicine early. If you are diabetic, you may get a false positive result for sugar in your urine with certain brands of urine tests. Check with your doctor. Do not treat diarrhea with over-the-counter products. Contact your doctor if you have diarrhea that lasts more than 2 days or if the diarrhea is severe and watery. What side effects may I notice from receiving this medicine? Side effects that you should report to your doctor or health care professional as soon as possible: -allergic reactions like skin rash, itching or hives, swelling of the face, lips, or tongue -breathing problems -dark urine -redness, blistering, peeling or loosening of the skin, including inside the mouth -seizures -severe or watery diarrhea -trouble passing urine or change in the amount of urine -unusual bleeding or bruising -unusually weak or tired -yellowing of the eyes or skin Side effects that usually do not require medical attention (report to your doctor or health care professional if they continue or are bothersome): -dizziness -headache -stomach upset -trouble sleeping This list may not describe all possible side effects. Call your doctor for medical advice about side effects. You may report side effects to FDA at 1-800-FDA-1088. Where should I keep my medicine? Keep out   of the reach of children. Store between 68 and 77 degrees F (20 and 25 degrees C). Keep bottle closed tightly. Throw away any unused medicine after the expiration date. NOTE: This sheet is a summary. It may not cover all possible  information. If you have questions about this medicine, talk to your doctor, pharmacist, or health care provider.  2018 Elsevier/Gold Standard (2007-08-23 14:10:59) Sinusitis, Adult Sinusitis is soreness and inflammation of your sinuses. Sinuses are hollow spaces in the bones around your face. Your sinuses are located:  Around your eyes.  In the middle of your forehead.  Behind your nose.  In your cheekbones.  Your sinuses and nasal passages are lined with a stringy fluid (mucus). Mucus normally drains out of your sinuses. When your nasal tissues become inflamed or swollen, the mucus can become trapped or blocked so air cannot flow through your sinuses. This allows bacteria, viruses, and funguses to grow, which leads to infection. Sinusitis can develop quickly and last for 7?10 days (acute) or for more than 12 weeks (chronic). Sinusitis often develops after a cold. What are the causes? This condition is caused by anything that creates swelling in the sinuses or stops mucus from draining, including:  Allergies.  Asthma.  Bacterial or viral infection.  Abnormally shaped bones between the nasal passages.  Nasal growths that contain mucus (nasal polyps).  Narrow sinus openings.  Pollutants, such as chemicals or irritants in the air.  A foreign object stuck in the nose.  A fungal infection. This is rare.  What increases the risk? The following factors may make you more likely to develop this condition:  Having allergies or asthma.  Having had a recent cold or respiratory tract infection.  Having structural deformities or blockages in your nose or sinuses.  Having a weak immune system.  Doing a lot of swimming or diving.  Overusing nasal sprays.  Smoking.  What are the signs or symptoms? The main symptoms of this condition are pain and a feeling of pressure around the affected sinuses. Other symptoms include:  Upper toothache.  Earache.  Headache.  Bad  breath.  Decreased sense of smell and taste.  A cough that may get worse at night.  Fatigue.  Fever.  Thick drainage from your nose. The drainage is often green and it may contain pus (purulent).  Stuffy nose or congestion.  Postnasal drip. This is when extra mucus collects in the throat or back of the nose.  Swelling and warmth over the affected sinuses.  Sore throat.  Sensitivity to light.  How is this diagnosed? This condition is diagnosed based on symptoms, a medical history, and a physical exam. To find out if your condition is acute or chronic, your health care provider may:  Look in your nose for signs of nasal polyps.  Tap over the affected sinus to check for signs of infection.  View the inside of your sinuses using an imaging device that has a light attached (endoscope).  If your health care provider suspects that you have chronic sinusitis, you may also:  Be tested for allergies.  Have a sample of mucus taken from your nose (nasal culture) and checked for bacteria.  Have a mucus sample examined to see if your sinusitis is related to an allergy.  If your sinusitis does not respond to treatment and it lasts longer than 8 weeks, you may have an MRI or CT scan to check your sinuses. These scans also help to determine how severe your infection  is. In rare cases, a bone biopsy may be done to rule out more serious types of fungal sinus disease. How is this treated? Treatment for sinusitis depends on the cause and whether your condition is chronic or acute. If a virus is causing your sinusitis, your symptoms will go away on their own within 10 days. You may be given medicines to relieve your symptoms, including:  Topical nasal decongestants. They shrink swollen nasal passages and let mucus drain from your sinuses.  Antihistamines. These drugs block inflammation that is triggered by allergies. This can help to ease swelling in your nose and sinuses.  Topical nasal  corticosteroids. These are nasal sprays that ease inflammation and swelling in your nose and sinuses.  Nasal saline washes. These rinses can help to get rid of thick mucus in your nose.  If your condition is caused by bacteria, you will be given an antibiotic medicine. If your condition is caused by a fungus, you will be given an antifungal medicine. Surgery may be needed to correct underlying conditions, such as narrow nasal passages. Surgery may also be needed to remove polyps. Follow these instructions at home: Medicines  Take, use, or apply over-the-counter and prescription medicines only as told by your health care provider. These may include nasal sprays.  If you were prescribed an antibiotic medicine, take it as told by your health care provider. Do not stop taking the antibiotic even if you start to feel better. Hydrate and Humidify  Drink enough water to keep your urine clear or pale yellow. Staying hydrated will help to thin your mucus.  Use a cool mist humidifier to keep the humidity level in your home above 50%.  Inhale steam for 10-15 minutes, 3-4 times a day or as told by your health care provider. You can do this in the bathroom while a hot shower is running.  Limit your exposure to cool or dry air. Rest  Rest as much as possible.  Sleep with your head raised (elevated).  Make sure to get enough sleep each night. General instructions  Apply a warm, moist washcloth to your face 3-4 times a day or as told by your health care provider. This will help with discomfort.  Wash your hands often with soap and water to reduce your exposure to viruses and other germs. If soap and water are not available, use hand sanitizer.  Do not smoke. Avoid being around people who are smoking (secondhand smoke).  Keep all follow-up visits as told by your health care provider. This is important. Contact a health care provider if:  You have a fever.  Your symptoms get worse.  Your  symptoms do not improve within 10 days. Get help right away if:  You have a severe headache.  You have persistent vomiting.  You have pain or swelling around your face or eyes.  You have vision problems.  You develop confusion.  Your neck is stiff.  You have trouble breathing. This information is not intended to replace advice given to you by your health care provider. Make sure you discuss any questions you have with your health care provider. Document Released: 06/01/2005 Document Revised: 01/26/2016 Document Reviewed: 03/27/2015 Elsevier Interactive Patient Education  2017 [employer]

## 2017-04-13 ENCOUNTER — Encounter: Payer: Self-pay | Admitting: Medical

## 2017-04-13 ENCOUNTER — Ambulatory Visit: Payer: Self-pay | Admitting: Medical

## 2017-04-13 VITALS — BP 102/66 | HR 56 | Temp 97.9°F | Resp 16 | Ht 67.0 in | Wt 140.0 lb

## 2017-04-13 DIAGNOSIS — J0100 Acute maxillary sinusitis, unspecified: Secondary | ICD-10-CM

## 2017-04-13 MED ORDER — AMOXICILLIN-POT CLAVULANATE 875-125 MG PO TABS: 1.0000 | ORAL_TABLET | Freq: Two times a day (BID) | ORAL | 0 refills | Status: DC

## 2017-04-13 NOTE — Patient Instructions (Signed)
Amoxicillin; Clavulanic Acid tablets What is this medicine? AMOXICILLIN; CLAVULANIC ACID (a mox i SIL in; KLAV yoo lan ic AS id) is a penicillin antibiotic. It is used to treat certain kinds of bacterial infections. It will not work for colds, flu, or other viral infections. This medicine may be used for other purposes; ask your health care provider or pharmacist if you have questions. COMMON BRAND NAME(S): Augmentin What should I tell my health care provider before I take this medicine? They need to know if you have any of these conditions: -bowel disease, like colitis -kidney disease -liver disease -mononucleosis -an unusual or allergic reaction to amoxicillin, penicillin, cephalosporin, other antibiotics, clavulanic acid, other medicines, foods, dyes, or preservatives -pregnant or trying to get pregnant -breast-feeding How should I use this medicine? Take this medicine by mouth with a full glass of water. Follow the directions on the prescription label. Take at the start of a meal. Do not crush or chew. If the tablet has a score line, you may cut it in half at the score line for easier swallowing. Take your medicine at regular intervals. Do not take your medicine more often than directed. Take all of your medicine as directed even if you think you are better. Do not skip doses or stop your medicine early. Talk to your pediatrician regarding the use of this medicine in children. Special care may be needed. Overdosage: If you think you have taken too much of this medicine contact a poison control center or emergency room at once. NOTE: This medicine is only for you. Do not share this medicine with others. What if I miss a dose? If you miss a dose, take it as soon as you can. If it is almost time for your next dose, take only that dose. Do not take double or extra doses. What may interact with this medicine? -allopurinol -anticoagulants -birth control pills -methotrexate -probenecid This  list may not describe all possible interactions. Give your health care provider a list of all the medicines, herbs, non-prescription drugs, or dietary supplements you use. Also tell them if you smoke, drink alcohol, or use illegal drugs. Some items may interact with your medicine. What should I watch for while using this medicine? Tell your doctor or health care professional if your symptoms do not improve. Do not treat diarrhea with over the counter products. Contact your doctor if you have diarrhea that lasts more than 2 days or if it is severe and watery. If you have diabetes, you may get a false-positive result for sugar in your urine. Check with your doctor or health care professional. Birth control pills may not work properly while you are taking this medicine. Talk to your doctor about using an extra method of birth control. What side effects may I notice from receiving this medicine? Side effects that you should report to your doctor or health care professional as soon as possible: -allergic reactions like skin rash, itching or hives, swelling of the face, lips, or tongue -breathing problems -dark urine -fever or chills, sore throat -redness, blistering, peeling or loosening of the skin, including inside the mouth -seizures -trouble passing urine or change in the amount of urine -unusual bleeding, bruising -unusually weak or tired -white patches or sores in the mouth or throat Side effects that usually do not require medical attention (report to your doctor or health care professional if they continue or are bothersome): -diarrhea -dizziness -headache -nausea, vomiting -stomach upset -vaginal or anal irritation This list may   not describe all possible side effects. Call your doctor for medical advice about side effects. You may report side effects to FDA at 1-800-FDA-1088. Where should I keep my medicine? Keep out of the reach of children. Store at room temperature below 25 degrees  C (77 degrees F). Keep container tightly closed. Throw away any unused medicine after the expiration date. NOTE: This sheet is a summary. It may not cover all possible information. If you have questions about this medicine, talk to your doctor, pharmacist, or health care provider.  2018 Elsevier/Gold Standard (2007-08-25 12:04:30)  Sinusitis, Adult Sinusitis is soreness and inflammation of your sinuses. Sinuses are hollow spaces in the bones around your face. They are located:  Around your eyes.  In the middle of your forehead.  Behind your nose.  In your cheekbones.  Your sinuses and nasal passages are lined with a stringy fluid (mucus). Mucus normally drains out of your sinuses. When your nasal tissues get inflamed or swollen, the mucus can get trapped or blocked so air cannot flow through your sinuses. This lets bacteria, viruses, and funguses grow, and that leads to infection. Follow these instructions at home: Medicines  Take, use, or apply over-the-counter and prescription medicines only as told by your doctor. These may include nasal sprays.  If you were prescribed an antibiotic medicine, take it as told by your doctor. Do not stop taking the antibiotic even if you start to feel better. Hydrate and Humidify  Drink enough water to keep your pee (urine) clear or pale yellow.  Use a cool mist humidifier to keep the humidity level in your home above 50%.  Breathe in steam for 10-15 minutes, 3-4 times a day or as told by your doctor. You can do this in the bathroom while a hot shower is running.  Try not to spend time in cool or dry air. Rest  Rest as much as possible.  Sleep with your head raised (elevated).  Make sure to get enough sleep each night. General instructions  Put a warm, moist washcloth on your face 3-4 times a day or as told by your doctor. This will help with discomfort.  Wash your hands often with soap and water. If there is no soap and water, use hand  sanitizer.  Do not smoke. Avoid being around people who are smoking (secondhand smoke).  Keep all follow-up visits as told by your doctor. This is important. Contact a doctor if:  You have a fever.  Your symptoms get worse.  Your symptoms do not get better within 10 days. Get help right away if:  You have a very bad headache.  You cannot stop throwing up (vomiting).  You have pain or swelling around your face or eyes.  You have trouble seeing.  You feel confused.  Your neck is stiff.  You have trouble breathing. This information is not intended to replace advice given to you by your health care provider. Make sure you discuss any questions you have with your health care provider. Document Released: 11/18/2007 Document Revised: 01/26/2016 Document Reviewed: 03/27/2015 Elsevier Interactive Patient Education  2018 Elsevier Inc.  

## 2017-04-13 NOTE — Progress Notes (Signed)
   Subjective:    Patient ID: Linda Webster, female    DOB: 04/22/1981, 36 y.o.   MRN: [id number]  HPI 36 yo female in non acute distress , sick with nasal congestion, yellow green discharge, cough improving. Pressure and pain in maxillaly sinuses. Not breastfeeding now stopped on Sunday.  Says of her sinus infection in September that she got completely well.  Review of Systems  Constitutional: Positive for fatigue. Negative for chills and fever.  HENT: Positive for congestion, postnasal drip, rhinorrhea, sinus pain and sinus pressure. Negative for ear pain, sneezing, sore throat and voice change.   Eyes: Negative for discharge and itching.  Respiratory: Positive for cough. Negative for shortness of breath.   Cardiovascular: Negative for chest pain.  Gastrointestinal: Negative for abdominal pain.  Genitourinary: Negative for dysuria.  Musculoskeletal: Negative for joint swelling and myalgias.  Skin: Negative for rash.  Allergic/Immunologic: Positive for environmental allergies. Negative for food allergies.  Neurological: Negative for dizziness, syncope, light-headedness and headaches.  Hematological: Negative for adenopathy.  Psychiatric/Behavioral: Negative for behavioral problems, self-injury and suicidal ideas.       Objective:   Physical Exam  Constitutional: She is oriented to person, place, and time. She appears well-developed and well-nourished.  HENT:  Head: Normocephalic and atraumatic.  Right Ear: External ear and ear canal normal. A middle ear effusion is present.  Left Ear: Hearing, external ear and ear canal normal. A middle ear effusion is present.  Nose: Mucosal edema and rhinorrhea present. Right sinus exhibits maxillary sinus tenderness. Left sinus exhibits maxillary sinus tenderness and frontal sinus tenderness.  Mouth/Throat: Oropharynx is clear and moist and mucous membranes are normal.  Eyes: Pupils are equal, round, and reactive to light. Conjunctivae and EOM are  normal.  Neck: Normal range of motion. Neck supple.  Cardiovascular: Normal rate, regular rhythm and normal heart sounds.  Exam reveals no gallop and no friction rub.   No murmur heard. Pulmonary/Chest: Effort normal and breath sounds normal.  Lymphadenopathy:    She has no cervical adenopathy.  Neurological: She is alert and oriented to person, place, and time.  Skin: Skin is warm and dry.  Psychiatric: She has a normal mood and affect. Her behavior is normal. Judgment and thought content normal.  Nursing note and vitals reviewed.         Assessment & Plan:  Sinusitis Meds ordered this encounter  Medications  . amoxicillin-clavulanate (AUGMENTIN) 875-125 MG tablet    Sig: Take 1 tablet by mouth 2 (two) times daily.    Dispense:  20 tablet    Refill:  0   Reviewed with patient no to breast feed with this medication.Rest increase fluids , return in 3-5 days if not improving. Patient verbalizes understanding and has no questions at discharge.

## 2017-12-16 IMAGING — CT CT ANGIO CHEST
1 of 2 series · 19 of 32 positions shown · IV contrast (OMNIPAQUE)
Comparison: None.

CLINICAL DATA: Shortness of breath and left calf region swelling

EXAM:
CT ANGIOGRAPHY CHEST WITH CONTRAST
TECHNIQUE: Multidetector CT imaging of the chest was performed using the
standard protocol during bolus administration of intravenous
contrast. Multiplanar CT image reconstructions and MIPs were
obtained to evaluate the vascular anatomy.
CONTRAST:  100 mL Isovue 370 nonionic

[Series 6: (person_name) thins · axial · 0.64mm/px · z∈[+828,+1081]mm · 19 of 404 slices shown]
[im 21/404  lung]
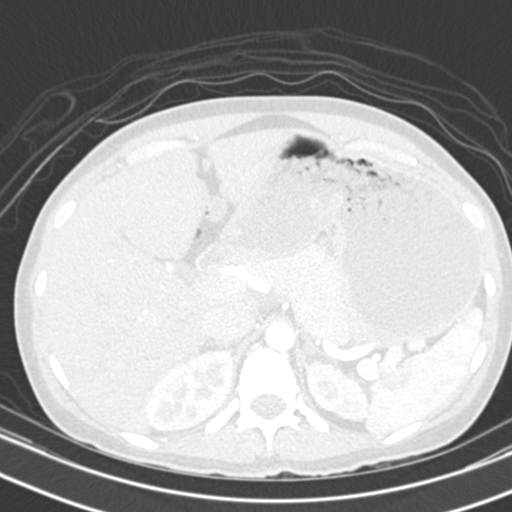
[im 41/404  mediastinal]
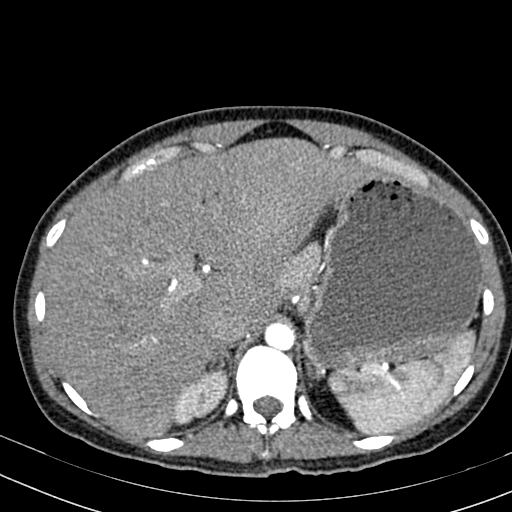
[im 61/404  lung]
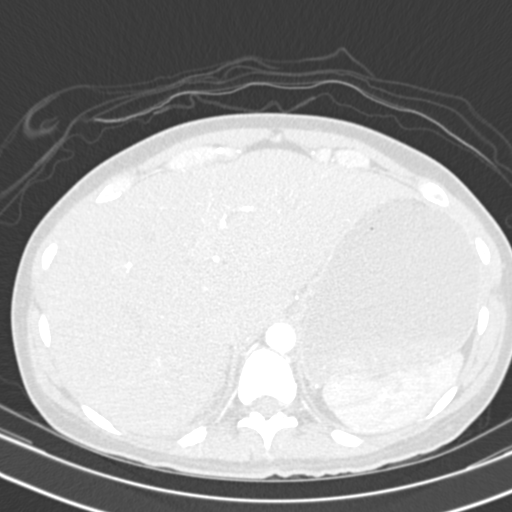
[im 101/404  mediastinal]
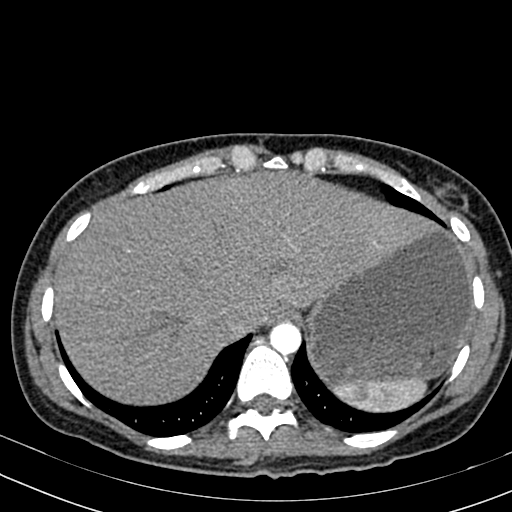
[im 121/404  lung]
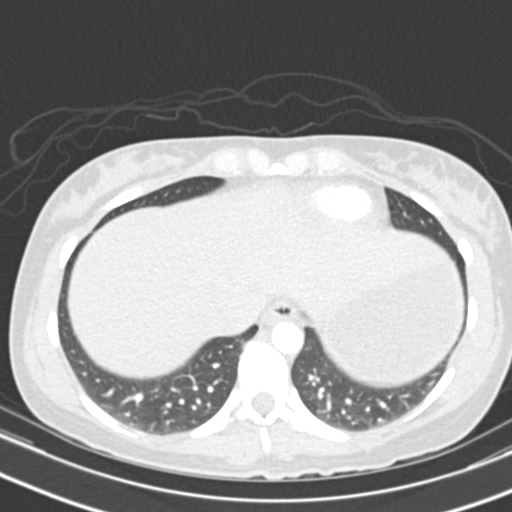
[im 135/404  mediastinal]
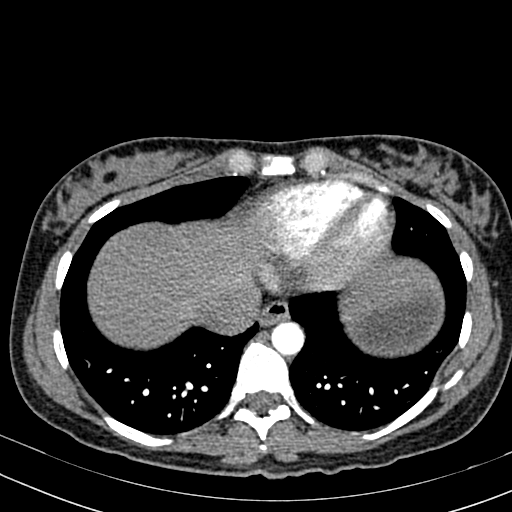
[im 142/404  lung]
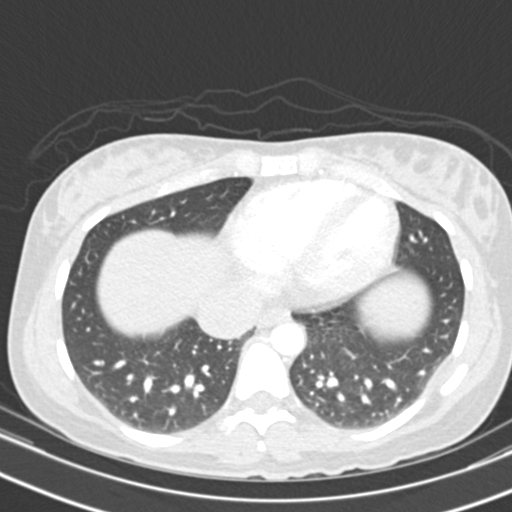
[im 162/404  mediastinal]
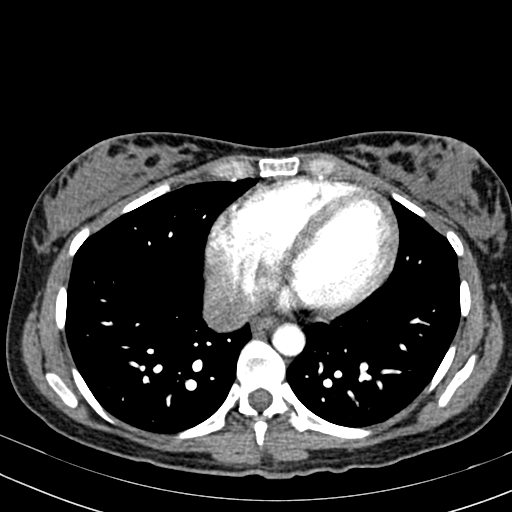
[im 182/404  lung]
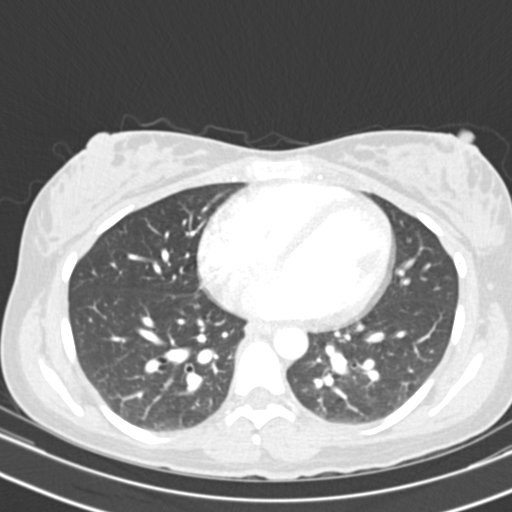
[im 202/404  mediastinal]
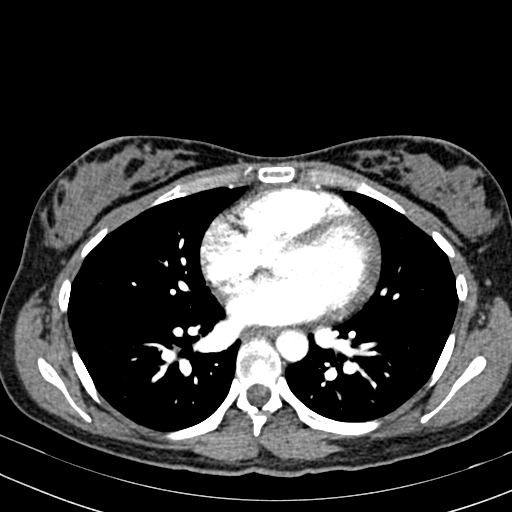
[im 222/404  lung]
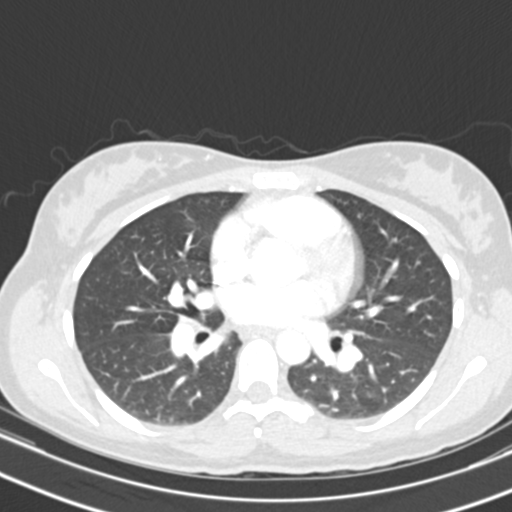
[im 242/404  mediastinal]
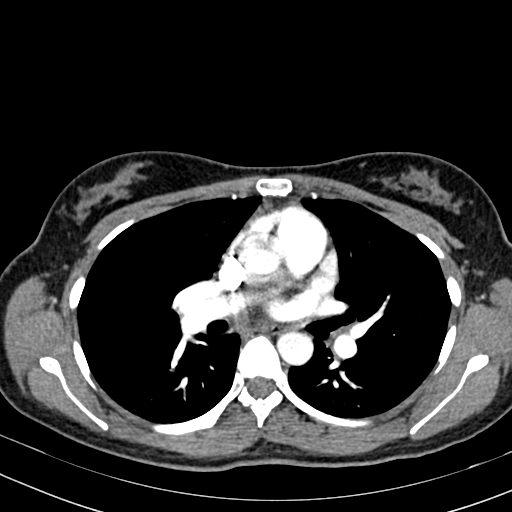
[im 262/404  lung]
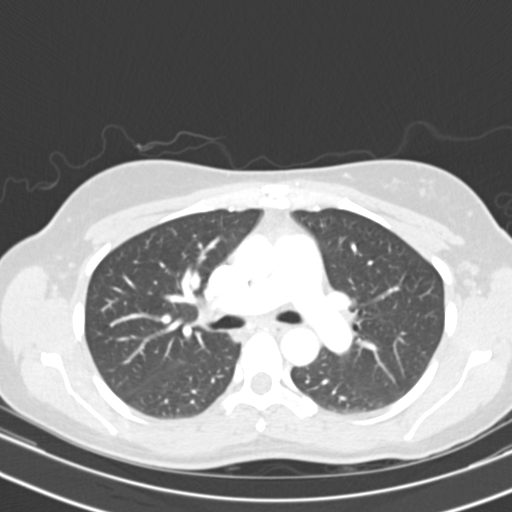
[im 269/404  mediastinal]
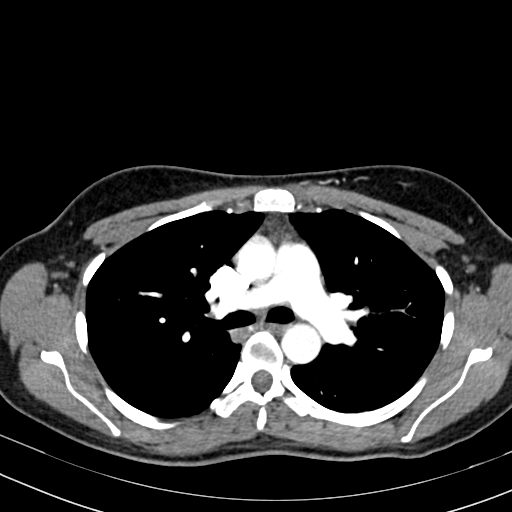
[im 283/404  lung]
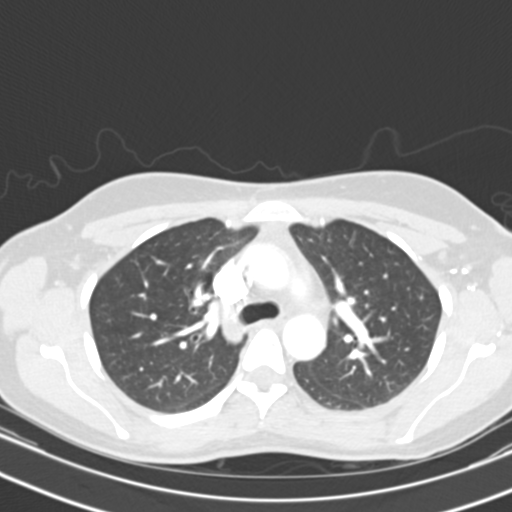
[im 303/404  mediastinal]
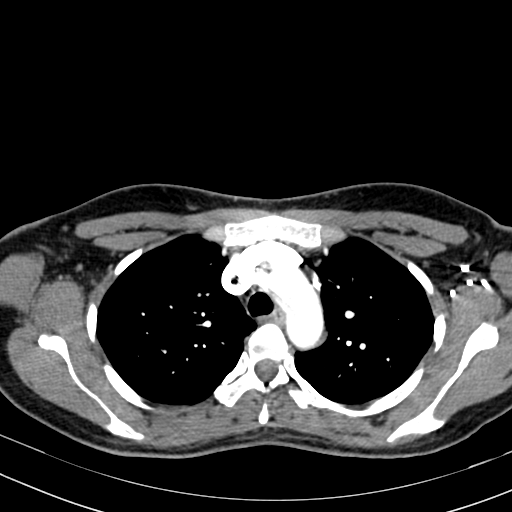
[im 343/404  lung]
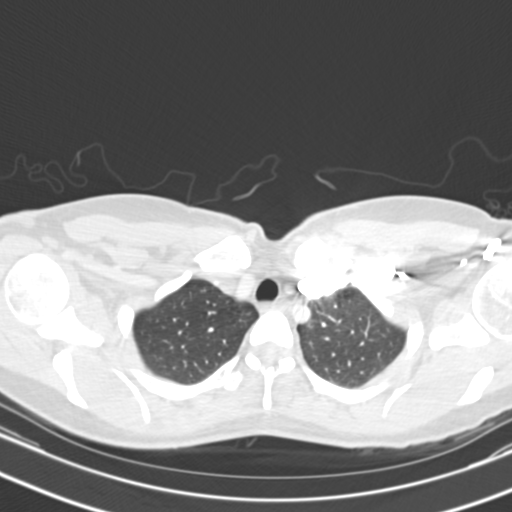
[im 363/404  mediastinal]
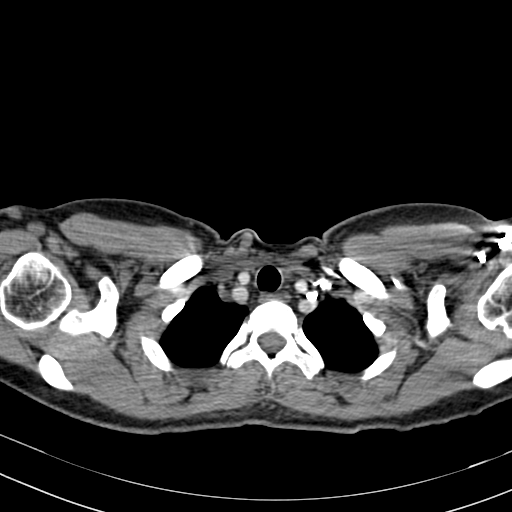
[im 383/404  lung]
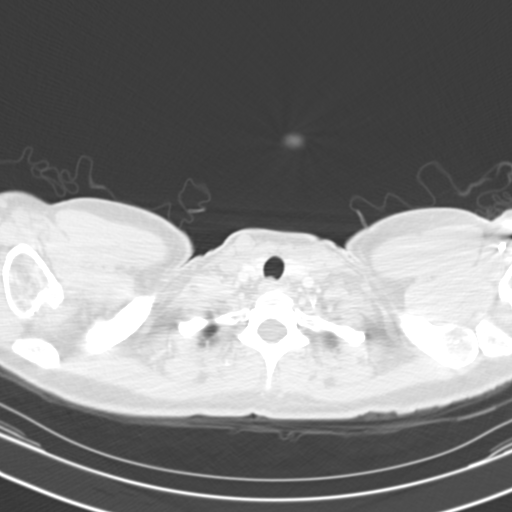

[19 of 32 positions shown; findings below may reference images not displayed]

FINDINGS: Cardiovascular: There is no demonstrable pulmonary embolus. There is
no thoracic aortic aneurysm or dissection. Visualized great vessels
appear normal. There is no appreciable pericardial thickening or
effusion.

Mediastinum/Nodes: Visualized thyroid appears normal. There is no
demonstrable thoracic adenopathy.

Lungs/Pleura: There is no edema or consolidation. No pleural
effusion or pleural thickening evident. There is slight dependent
atelectasis in the bases.

Upper Abdomen: Visualized upper abdominal structures appear normal.
Stomach is distended with fluid and food material.

Musculoskeletal: There are no blastic or lytic bone lesions.

Review of the MIP images confirms the above findings.
IMPRESSION: No demonstrable pulmonary embolus. No thoracic aortic dissection. No
lung edema or consolidation. No evident adenopathy.

## 2018-08-08 ENCOUNTER — Ambulatory Visit: Payer: Self-pay | Admitting: Medical

## 2018-08-08 ENCOUNTER — Encounter: Payer: Self-pay | Admitting: Medical

## 2018-08-08 VITALS — BP 106/65 | HR 69 | Temp 98.8°F | Resp 16 | Wt 138.8 lb

## 2018-08-08 DIAGNOSIS — J0110 Acute frontal sinusitis, unspecified: Secondary | ICD-10-CM

## 2018-08-08 MED ORDER — AMOXICILLIN-POT CLAVULANATE 875-125 MG PO TABS: 1.0000 | ORAL_TABLET | Freq: Two times a day (BID) | ORAL | 0 refills | Status: DC

## 2018-08-08 NOTE — Progress Notes (Addendum)
Subjective:    Patient ID: Linda Webster, female    DOB: 09/18/1980, 38 y.o.   MRN: [id number]  HPI 38 yo female in non acute distress.  Started last Thursday with congestion and pressure in forehead.   Breast feeding : Not breastfeeding  Taking Elderberry and Robitussin cough. Also taking a natural sudafed.  Blood pressure 106/65, pulse 69, temperature 98.8 F (37.1 C), temperature source Tympanic, resp. rate 16, weight 138 lb 12.8 oz (63 kg), SpO2 100 %,   No Known Allergies   Traveling  To [place] on Friday. Review of Systems  Constitutional: Positive for fatigue. Negative for chills and fever.  HENT: Positive for congestion, rhinorrhea (green ), sinus pressure, sinus pain and sore throat. Negative for ear discharge and ear pain.   Eyes: Negative for discharge and itching.  Respiratory: Positive for cough. Negative for shortness of breath and wheezing.   Cardiovascular: Negative for chest pain.  Gastrointestinal: Positive for nausea and vomiting (08/02/2018). Negative for abdominal pain and diarrhea.  Genitourinary: Negative for dysuria.  Musculoskeletal: Negative for myalgias.  Skin: Positive for rash.  Neurological: Negative for syncope, light-headedness and headaches.  Hematological: Negative for adenopathy.  Psychiatric/Behavioral: Negative for behavioral problems, self-injury and suicidal ideas.       Objective:   Physical Exam Vitals signs and nursing note reviewed.  Constitutional:      Appearance: Normal appearance. She is normal weight.  HENT:     Head: Normocephalic and atraumatic.     Jaw: There is normal jaw occlusion.     Right Ear: Hearing, ear canal and external ear normal. A middle ear effusion is present.     Left Ear: Hearing, ear canal and external ear normal. A middle ear effusion is present.     Nose: Congestion present. No rhinorrhea.     Left Turbinates: Swollen.     Mouth/Throat:     Lips: Pink.     Mouth: Mucous membranes are moist.     Pharynx:  Oropharynx is clear. Posterior oropharyngeal erythema (mild posterior pharynx) present.     Tonsils: Swelling: 2+ on the right. 2+ on the left.  Eyes:     Extraocular Movements: Extraocular movements intact.     Conjunctiva/sclera: Conjunctivae normal.     Pupils: Pupils are equal, round, and reactive to light.  Neck:     Musculoskeletal: Normal range of motion.  Cardiovascular:     Rate and Rhythm: Normal rate and regular rhythm.  Pulmonary:     Effort: Pulmonary effort is normal.     Breath sounds: Normal breath sounds.  Musculoskeletal: Normal range of motion.  Lymphadenopathy:     Cervical: No cervical adenopathy.  Skin:    General: Skin is warm and dry.  Neurological:     General: No focal deficit present.     Mental Status: She is alert and oriented to person, place, and time.  Psychiatric:        Mood and Affect: Mood normal.        Behavior: Behavior normal.        Thought Content: Thought content normal.        Judgment: Judgment normal.      cough in room noted. Assessment & Plan:  Sinusitis Frontal Meds ordered this encounter  Medications  . amoxicillin-clavulanate (AUGMENTIN) 875-125 MG tablet    Sig: Take 1 tablet by mouth 2 (two) times daily.    Dispense:  20 tablet    Refill:  0  rest and  increase fluids. OTC Motrin or Tylenol for fever or pain. Return in  3-5 days if not improving. Given [place] referral line number to establish  Primary care. Patient verbazlizes understanding and has no questions at discharge.

## 2018-08-26 ENCOUNTER — Other Ambulatory Visit: Payer: Self-pay

## 2018-08-26 ENCOUNTER — Ambulatory Visit: Payer: Self-pay | Admitting: Nurse Practitioner

## 2018-08-26 ENCOUNTER — Encounter: Payer: Self-pay | Admitting: Nurse Practitioner

## 2018-08-26 VITALS — BP 113/64 | HR 80 | Temp 98.4°F | Resp 16 | Ht 67.0 in | Wt 137.0 lb

## 2018-08-26 DIAGNOSIS — B9789 Other viral agents as the cause of diseases classified elsewhere: Secondary | ICD-10-CM

## 2018-08-26 DIAGNOSIS — J329 Chronic sinusitis, unspecified: Principal | ICD-10-CM

## 2018-08-26 MED ORDER — FLUTICASONE PROPIONATE 50 MCG/ACT NA SUSP: NASAL | 6 refills | Status: AC

## 2018-08-26 NOTE — Progress Notes (Signed)
   Subjective:    Patient ID: Linda Webster, female    DOB: December 11, 1980, 38 y.o.   MRN: [id number]  HPI Linda Webster [facility] today with a dry cough x1 week, sore throat x3 days, and complaints of lymph nodes feeling sore.  She was seen in office 08/08/2018 by another provider with diagnosis of sinusitis and put on Augmentin x10 days which she reports that she completed the treatment and felt better but unsure if returning. She denies fever, SOB, facial or ear pain but pressure. Has taken routine Allegra and Elderberry without much relief.    Review of Systems  Constitutional: Negative for fatigue and fever.  HENT: Positive for sore throat.   Respiratory: Positive for cough. Negative for shortness of breath and wheezing.   Cardiovascular: Negative for chest pain.       Objective:   Physical Exam Vitals signs reviewed.  Constitutional:      General: She is not in acute distress.    Appearance: Normal appearance. She is well-developed. She is not ill-appearing.  HENT:     Head: Normocephalic and atraumatic.     Comments: No maxillary or frontal sinus tenderness    Right Ear: Ear canal normal.     Left Ear: Ear canal normal.     Nose:     Comments: Turbinates erythematous with clear discharge to right nare.    Mouth/Throat:     Mouth: Mucous membranes are moist.     Comments: pharnyx mildly injected with cobblestoning Neck:     Musculoskeletal: Normal range of motion and neck supple.  Cardiovascular:     Rate and Rhythm: Normal rate and regular rhythm.     Heart sounds: Normal heart sounds.  Pulmonary:     Effort: Pulmonary effort is normal. No respiratory distress.     Breath sounds: Normal breath sounds.  Abdominal:     General: Bowel sounds are normal.     Palpations: Abdomen is soft. There is no mass.     Tenderness: There is no abdominal tenderness.  Musculoskeletal: Normal range of motion.  Lymphadenopathy:     Cervical: Cervical adenopathy  present.  Skin:    General: Skin is warm and dry.  Neurological:     Mental Status: She is alert and oriented to person, place, and time.  Psychiatric:        Mood and Affect: Mood normal.           Assessment & Plan:

## 2018-08-26 NOTE — Patient Instructions (Addendum)
Nice meeting you today Linda Webster!  Please hold your Allegra right now and start over the counter Claritin daily. If you should get drowsy which Claritin generally doesn't cause that take it at night (4 samples of this given today). After 1 week or so you can resume your Allegra as directed  Start Fluticasone (Flonase) 1 spray twice daily and use after you flush with normal saline  Fluids, rest encouraged  Consider adding vitamin C or eating oranges to boost your immune system  Encouraged patient to call the office or primary care doctor for an appointment if no improvement in symptoms or if symptoms change or worsen after 72 hours of planned treatment. Patient verbalized understanding of all instructions given/reviewed and has no further questions or concerns at this time

## 2018-09-02 ENCOUNTER — Telehealth: Payer: BLUE CROSS/BLUE SHIELD | Admitting: Family

## 2018-09-02 DIAGNOSIS — R0602 Shortness of breath: Secondary | ICD-10-CM

## 2018-09-02 MED ORDER — PROMETHAZINE-DM 6.25-15 MG/5ML PO SYRP: 5.0000 mL | ORAL_SOLUTION | Freq: Four times a day (QID) | ORAL | 0 refills | Status: AC | PRN

## 2018-09-02 NOTE — Progress Notes (Signed)
E-Visit for Corona Virus Screening Based on your current symptoms, it seems unlikely that your symptoms are related to the Corona virus.   Coronavirus disease 2019 (COVID-19) is a respiratory illness that can spread from person to person. The virus that causes COVID-19 is a new virus that was first identified in the country of [place] but is now found in multiple other countries and has spread to the [place].  Symptoms associated with the virus are mild to severe fever, cough, and shortness of breath. There is currently no vaccine to protect against COVID-19, and there is no specific antiviral treatment for the virus.  It is vitally important that if you feel that you have an infection such as this virus or any other virus that you stay home and away from places where you may spread it to others.  Currently, not all patients are being tested. If the symptoms are mild and there is not a known exposure, performing the test is not indicated.  You can use medication such as A prescription cough medication called Phenergan DM 6.25 mg/15 mg. You make take one teaspoon / 5 ml every 4-6 hours as needed for cough  Reduce your risk of any infection by using the same precautions used for avoiding the common cold or flu:  . Wash your hands often with soap and warm water for at least 20 seconds.  If soap and water are not readily available, use an alcohol-based hand sanitizer with at least 60% alcohol.  . If coughing or sneezing, cover your mouth and nose by coughing or sneezing into the elbow areas of your shirt or coat, into a tissue or into your sleeve (not your hands). . Avoid shaking hands with others and consider head nods or verbal greetings only. . Avoid touching your eyes, nose, or mouth with unwashed hands.  . Avoid close contact with people who are sick. . Avoid places or events with large numbers of people in one location, like concerts or sporting events. . Carefully consider travel plans you  have or are making. . If you are planning any travel outside or inside the [place], visit the CDC's Travelers' Health webpage for the latest health notices. . If you have some symptoms but not all symptoms, continue to monitor at home and seek medical attention if your symptoms worsen. . If you are having a medical emergency, call 911.  HOME CARE . Only take medications as instructed by your medical team. . Drink plenty of fluids and get plenty of rest. . A steam or ultrasonic humidifier can help if you have congestion.   GET HELP RIGHT AWAY IF: . You develop worsening fever. . You become short of breath . You cough up blood. . Your symptoms become more severe MAKE SURE YOU   Understand these instructions.  Will watch your condition.  Will get help right away if you are not doing well or get worse.  Your e-visit answers were reviewed by a board certified advanced clinical practitioner to complete your personal care plan.  Depending on the condition, your plan could have included both over the counter or prescription medications.  If there is a problem please reply once you have received a response from your provider. Your safety is important to us.  If you have drug allergies check your prescription carefully.    You can use MyChart to ask questions about today's visit, request a non-urgent call back, or ask for a work or school excuse for 24   hours related to this e-Visit. If it has been greater than 24 hours you will need to follow up with your provider, or enter a new e-Visit to address those concerns. You will get an e-mail in the next two days asking about your experience.  I hope that your e-visit has been valuable and will speed your recovery. Thank you for using e-visits.
